# Patient Record
Sex: Female | Born: 1935
Health system: Southern US, Community
[De-identification: ages and names within clinical notes are randomized; demographics above are authoritative.]

## PROBLEM LIST (undated history)

## (undated) DIAGNOSIS — I10 Essential (primary) hypertension: Secondary | ICD-10-CM

## (undated) DIAGNOSIS — L120 Bullous pemphigoid: Secondary | ICD-10-CM

## (undated) HISTORY — DX: Bullous pemphigoid: L12.0

## (undated) HISTORY — PX: SPINE SURGERY: SHX786

## (undated) HISTORY — DX: Essential (primary) hypertension: I10

---

## 1978-08-02 HISTORY — PX: APPENDECTOMY: SHX54

## 1978-08-02 HISTORY — PX: BREAST BIOPSY: SHX20

## 1978-08-02 HISTORY — PX: VAGINAL HYSTERECTOMY: SHX2639

## 2000-08-09 ENCOUNTER — Ambulatory Visit (HOSPITAL_BASED_OUTPATIENT_CLINIC_OR_DEPARTMENT_OTHER): Admission: RE | Admit: 2000-08-09 | Discharge: 2000-08-09 | Payer: Self-pay | Admitting: Plastic Surgery

## 2003-09-10 ENCOUNTER — Encounter: Admission: RE | Admit: 2003-09-10 | Discharge: 2003-09-10 | Payer: Self-pay | Admitting: Orthopaedic Surgery

## 2005-02-22 ENCOUNTER — Encounter: Admission: RE | Admit: 2005-02-22 | Discharge: 2005-02-22 | Payer: Self-pay | Admitting: Family Medicine

## 2014-08-13 DIAGNOSIS — L120 Bullous pemphigoid: Secondary | ICD-10-CM | POA: Diagnosis not present

## 2014-08-13 DIAGNOSIS — M25552 Pain in left hip: Secondary | ICD-10-CM | POA: Diagnosis not present

## 2014-08-13 DIAGNOSIS — E780 Pure hypercholesterolemia: Secondary | ICD-10-CM | POA: Diagnosis not present

## 2014-08-13 DIAGNOSIS — G8929 Other chronic pain: Secondary | ICD-10-CM | POA: Diagnosis not present

## 2014-08-15 DIAGNOSIS — L821 Other seborrheic keratosis: Secondary | ICD-10-CM | POA: Diagnosis not present

## 2014-08-15 DIAGNOSIS — L570 Actinic keratosis: Secondary | ICD-10-CM | POA: Diagnosis not present

## 2014-11-07 DIAGNOSIS — H04123 Dry eye syndrome of bilateral lacrimal glands: Secondary | ICD-10-CM | POA: Diagnosis not present

## 2014-11-07 DIAGNOSIS — H25013 Cortical age-related cataract, bilateral: Secondary | ICD-10-CM | POA: Diagnosis not present

## 2014-11-07 DIAGNOSIS — H2513 Age-related nuclear cataract, bilateral: Secondary | ICD-10-CM | POA: Diagnosis not present

## 2014-11-07 DIAGNOSIS — I7090 Unspecified atherosclerosis: Secondary | ICD-10-CM | POA: Diagnosis not present

## 2014-11-29 DIAGNOSIS — I10 Essential (primary) hypertension: Secondary | ICD-10-CM | POA: Diagnosis not present

## 2014-12-31 DIAGNOSIS — I10 Essential (primary) hypertension: Secondary | ICD-10-CM | POA: Diagnosis not present

## 2014-12-31 DIAGNOSIS — K219 Gastro-esophageal reflux disease without esophagitis: Secondary | ICD-10-CM | POA: Diagnosis not present

## 2015-01-04 DIAGNOSIS — T7840XA Allergy, unspecified, initial encounter: Secondary | ICD-10-CM | POA: Diagnosis not present

## 2015-02-04 DIAGNOSIS — I10 Essential (primary) hypertension: Secondary | ICD-10-CM | POA: Diagnosis not present

## 2015-02-04 DIAGNOSIS — E780 Pure hypercholesterolemia: Secondary | ICD-10-CM | POA: Diagnosis not present

## 2015-02-06 DIAGNOSIS — M25552 Pain in left hip: Secondary | ICD-10-CM | POA: Diagnosis not present

## 2015-02-06 DIAGNOSIS — G8929 Other chronic pain: Secondary | ICD-10-CM | POA: Diagnosis not present

## 2015-02-06 DIAGNOSIS — K219 Gastro-esophageal reflux disease without esophagitis: Secondary | ICD-10-CM | POA: Diagnosis not present

## 2015-02-06 DIAGNOSIS — E780 Pure hypercholesterolemia: Secondary | ICD-10-CM | POA: Diagnosis not present

## 2015-02-06 DIAGNOSIS — I10 Essential (primary) hypertension: Secondary | ICD-10-CM | POA: Diagnosis not present

## 2015-02-21 DIAGNOSIS — I10 Essential (primary) hypertension: Secondary | ICD-10-CM | POA: Diagnosis not present

## 2015-02-21 DIAGNOSIS — M791 Myalgia: Secondary | ICD-10-CM | POA: Diagnosis not present

## 2015-02-21 DIAGNOSIS — R42 Dizziness and giddiness: Secondary | ICD-10-CM | POA: Diagnosis not present

## 2015-02-21 DIAGNOSIS — K30 Functional dyspepsia: Secondary | ICD-10-CM | POA: Diagnosis not present

## 2015-02-26 DIAGNOSIS — I10 Essential (primary) hypertension: Secondary | ICD-10-CM | POA: Diagnosis not present

## 2015-02-26 DIAGNOSIS — R001 Bradycardia, unspecified: Secondary | ICD-10-CM | POA: Diagnosis not present

## 2015-03-10 DIAGNOSIS — I10 Essential (primary) hypertension: Secondary | ICD-10-CM | POA: Diagnosis not present

## 2015-03-10 DIAGNOSIS — K219 Gastro-esophageal reflux disease without esophagitis: Secondary | ICD-10-CM | POA: Diagnosis not present

## 2015-03-10 DIAGNOSIS — R197 Diarrhea, unspecified: Secondary | ICD-10-CM | POA: Diagnosis not present

## 2015-03-17 DIAGNOSIS — I10 Essential (primary) hypertension: Secondary | ICD-10-CM | POA: Diagnosis not present

## 2015-04-18 DIAGNOSIS — Z23 Encounter for immunization: Secondary | ICD-10-CM | POA: Diagnosis not present

## 2015-04-18 DIAGNOSIS — N183 Chronic kidney disease, stage 3 (moderate): Secondary | ICD-10-CM | POA: Diagnosis not present

## 2015-04-18 DIAGNOSIS — Z0000 Encounter for general adult medical examination without abnormal findings: Secondary | ICD-10-CM | POA: Diagnosis not present

## 2015-07-08 DIAGNOSIS — L821 Other seborrheic keratosis: Secondary | ICD-10-CM | POA: Diagnosis not present

## 2015-07-08 DIAGNOSIS — L570 Actinic keratosis: Secondary | ICD-10-CM | POA: Diagnosis not present

## 2015-07-24 DIAGNOSIS — M25551 Pain in right hip: Secondary | ICD-10-CM | POA: Diagnosis not present

## 2015-08-19 DIAGNOSIS — I10 Essential (primary) hypertension: Secondary | ICD-10-CM | POA: Diagnosis not present

## 2015-08-19 DIAGNOSIS — E7800 Pure hypercholesterolemia, unspecified: Secondary | ICD-10-CM | POA: Diagnosis not present

## 2015-08-19 DIAGNOSIS — K219 Gastro-esophageal reflux disease without esophagitis: Secondary | ICD-10-CM | POA: Diagnosis not present

## 2015-11-13 DIAGNOSIS — H2513 Age-related nuclear cataract, bilateral: Secondary | ICD-10-CM | POA: Diagnosis not present

## 2015-11-13 DIAGNOSIS — I7090 Unspecified atherosclerosis: Secondary | ICD-10-CM | POA: Diagnosis not present

## 2015-11-13 DIAGNOSIS — H25013 Cortical age-related cataract, bilateral: Secondary | ICD-10-CM | POA: Diagnosis not present

## 2015-11-13 DIAGNOSIS — H35033 Hypertensive retinopathy, bilateral: Secondary | ICD-10-CM | POA: Diagnosis not present

## 2016-01-05 ENCOUNTER — Encounter: Payer: Self-pay | Admitting: Family Medicine

## 2016-01-05 ENCOUNTER — Ambulatory Visit (INDEPENDENT_AMBULATORY_CARE_PROVIDER_SITE_OTHER): Payer: Medicare Other | Admitting: Family Medicine

## 2016-01-05 VITALS — BP 180/95 | HR 60 | Temp 98.4°F | Resp 12 | Ht 61.75 in | Wt 132.7 lb

## 2016-01-05 DIAGNOSIS — I10 Essential (primary) hypertension: Secondary | ICD-10-CM

## 2016-01-05 DIAGNOSIS — G4700 Insomnia, unspecified: Secondary | ICD-10-CM

## 2016-01-05 DIAGNOSIS — R001 Bradycardia, unspecified: Secondary | ICD-10-CM

## 2016-01-05 DIAGNOSIS — E782 Mixed hyperlipidemia: Secondary | ICD-10-CM | POA: Insufficient documentation

## 2016-01-05 DIAGNOSIS — E7800 Pure hypercholesterolemia, unspecified: Secondary | ICD-10-CM

## 2016-01-05 LAB — BASIC METABOLIC PANEL
BUN: 21 mg/dL (ref 6–23)
CALCIUM: 9.9 mg/dL (ref 8.4–10.5)
CO2: 29 meq/L (ref 19–32)
Chloride: 101 mEq/L (ref 96–112)
Creatinine, Ser: 0.85 mg/dL (ref 0.40–1.20)
GFR: 68.51 mL/min (ref >60.00)
Glucose, Bld: 89 mg/dL (ref 70–99)
POTASSIUM: 3.6 meq/L (ref 3.5–5.1)
SODIUM: 139 meq/L (ref 135–145)

## 2016-01-05 LAB — TSH: TSH: 2.27 u[IU]/mL (ref 0.35–4.50)

## 2016-01-05 NOTE — Progress Notes (Signed)
[name] is a 81 y.o.female, who is here today to established care with me and to follow on HTN.  Former PCP Amber Baird. She follows with ophthalmologists periodically. Today she is concerned about elevated BP. Dx with HTN in 11/2014, BP 200/90.  Currently she is on Lisinopril 20 mg, Carvedilol 6.25 mg bid, and HCTZ 25 mg daily. She is taking medications as instructed, no side effects reported, except for feeling cold at night,sometimes.  She has not noted unusual headache, visual changes, exertional chest pain or dyspnea,  focal weakness, or edema. Occasionally she feels like "there is not enough air in the room", mildly difficulty breathing. No dizziness or diaphoresis. No Hx of tobacco use. Denies Hx of CVD.  She has not tolerated Amlodipine or Atenolol in the past, the former one caused facial rash and the latter one bradycardia at 40/min. BP readings at home: 160's/70's x 4, 170/80 x 1, and 155/80 x 1. HR's at home 51-57/min.   Hyperlipidemia: She is on Pravastatin. Following a low fat diet. She has not noted side effects with medication.  Insomnia: Currently she is on gabapentin 300 mg at bedtime, initially it was a started about 1-2 years ago because post herpetic pain but noticed that she slept better with medication, so she continued it. She sleeps about 7 hours, feels rested next day, she denies any side effect from medication.  Takes Mg Oxide for muscle cramps. She is reporting lab work done in 08/2015, Mg is usually check. She lives alone.    Review of Systems  Constitutional: Negative for fever, activity change, appetite change, fatigue and unexpected weight change.  HENT: Negative for mouth sores, nosebleeds and trouble swallowing.   Eyes: Negative for pain, redness and visual disturbance.  Respiratory: Negative for apnea, cough, shortness of breath and wheezing.   Cardiovascular: Negative for chest pain, palpitations and leg swelling.    Gastrointestinal: Negative for nausea, vomiting and abdominal pain.       Negative for changes in bowel habits.  Endocrine: Positive for cold intolerance. Negative for polydipsia and polyphagia.  Genitourinary: Negative for dysuria, hematuria, decreased urine volume and difficulty urinating.  Musculoskeletal: Negative for back pain.  Skin: Negative for pallor and rash.  Neurological: Negative for seizures, syncope, weakness, numbness and headaches.  Psychiatric/Behavioral: Negative for hallucinations, confusion and sleep disturbance. The patient is not nervous/anxious.      No current outpatient prescriptions on file prior to visit.   No current facility-administered medications on file prior to visit.     Past Medical History  Diagnosis Date  . Hypertension   . Bullous pemphigoid     Allergies  Allergen Reactions  . Adhesive [Tape]   . Amlodipine   . Atenolol   . Sulfa Antibiotics     Social History   Social History  . Marital Status: Married    Spouse Name: N/A  . Number of Children: 1  . Years of Education: N/A   Social History Main Topics  . Smoking status: Never Smoker   . Smokeless tobacco: Never Used  . Alcohol Use: No  . Drug Use: No  . Sexual Activity: Not Asked   Other Topics Concern  . None   Social History Narrative    Filed Vitals:   01/05/16 1400  BP: 180/95  Pulse: 60  Temp: 98.4 F (36.9 C)  Resp: 12   Body mass index is 24.48 kg/(m^2).  SpO2 Readings from Last 3 Encounters:  01/05/16 92%  Physical Exam  Constitutional: She is oriented to person, place, and time. She appears well-developed and well-nourished. She does not appear ill. No distress.  HENT:  Head: Atraumatic.  Eyes: Conjunctivae and EOM are normal. Pupils are equal, round, and reactive to light.  Neck: No JVD present. No thyromegaly present.  Cardiovascular: Normal rate and regular rhythm.   Pulses:      Dorsalis pedis pulses are 2+ on the right side, and 2+  on the left side.  ? Soft SEM RUSB.  Respiratory: Effort normal and breath sounds normal. No respiratory distress.  GI: Soft. She exhibits no mass. There is no tenderness.  Musculoskeletal: She exhibits no edema.  Kyphosis.  Lymphadenopathy:    She has no cervical adenopathy.  Neurological: She is alert and oriented to person, place, and time. She has normal strength. No cranial nerve deficit. Coordination normal.  Stable gait with no assistance.  Skin: Skin is warm. She is not diaphoretic. No erythema. No pallor.  Psychiatric: She has a normal mood and affect.  Well groomed, good eye contact.    ASSESSMENT AND PLAN:   [place] was seen today for new patient (initial visit).  Diagnoses and all orders for this visit:  Essential hypertension  Not well controlled, re-checked SBP mildly lower that initial reading (218). Carvedilol decreased due to bradycardia and Lisinopril increased from 20 mg to 30 mg. Possible complications of elevated BP discussed. Annual eye examination to continue as well as low salt diet. Stop Aspirin for now. Clearly instructed about warning signs.  F/U in 2 weeks.  -     EKG 12-Lead -     Basic metabolic panel -     TSH  Bradycardia  Today HR on lower normal range but HR < 60 reported at home. Asymptomatic. Decrease Carvedilol. Continue monitoring. EKG today no acute ischemia, SR, normal axis and intervals, otherwise normal.No other available.  -     EKG 12-Lead  Hypercholesteremia  Stable. No changes in current management. F/U in 6-12 months.   Insomnia, unspecified  Stable. Explained Gabapentin is not usually recommended for insomnia but since she is tolerating well and it is helping, so changes in current management. Good sleep hygiene.  F/U in 6-12 months.     -Patient advised to return sooner than planned today if new concerns arise, she voices understanding.     Pier Laux G. Martinique, MD  [facility]. [place]  office.

## 2016-01-05 NOTE — Patient Instructions (Addendum)
A few things to remember from today's visit:   1. Essential hypertension  - lisinopril (PRINIVIL,ZESTRIL) 20 MG tablet; Take 1.5 tablets by mouth daily. Increased - carvedilol (COREG) 6.25 MG tablet; Take 0.5 tablets by mouth 2 (two) times daily. decreased - EKG [id number] - Basic metabolic panel - TSH  2. Hypercholesteremia  - pravastatin (PRAVACHOL) 40 MG tablet; Take 1 tablet by mouth daily.  3. Bradycardia  - EKG 12-Lead  4. Insomnia, unspecified  - gabapentin (NEURONTIN) 300 MG capsule; Take 1 capsule by mouth daily.   Some changes in the following medications: Increase lisinopril to 1.5 tablet daily, decrease carvedilol to half tablet 2 times daily. EKG was done today, otherwise normal. Continue monitoring blood pressure and pulse at home. Please arrange follow up appointment before leaving today.   BP goal < 150/90. If any chest pain, shortness of breath, headache, or other worrisome symptom seek immediate medical attention. Stop Aspirin for now.   If you sign-up for My chart, you can communicate easier with [place] in case you have any question or concern.

## 2016-01-07 ENCOUNTER — Telehealth: Payer: Self-pay

## 2016-01-07 NOTE — Telephone Encounter (Signed)
-----   Message from Betty G Martinique, MD sent at 01/07/2016  8:28 AM EDT ----- Normal lab results: thyroid function, glucose, and renal function. Thanks!

## 2016-01-07 NOTE — Telephone Encounter (Signed)
Called patient. Gave lab results. Patient verbalized understanding.  

## 2016-01-12 ENCOUNTER — Other Ambulatory Visit: Payer: Self-pay | Admitting: Family Medicine

## 2016-01-12 ENCOUNTER — Encounter: Payer: Self-pay | Admitting: Family Medicine

## 2016-01-12 ENCOUNTER — Ambulatory Visit (INDEPENDENT_AMBULATORY_CARE_PROVIDER_SITE_OTHER): Payer: Medicare Other | Admitting: Family Medicine

## 2016-01-12 VITALS — BP 172/84 | HR 73 | Temp 98.6°F | Resp 17 | Ht 61.75 in | Wt 131.6 lb

## 2016-01-12 DIAGNOSIS — I10 Essential (primary) hypertension: Secondary | ICD-10-CM | POA: Diagnosis not present

## 2016-01-12 MED ORDER — HYDRALAZINE HCL 25 MG PO TABS: 25.0000 mg | ORAL_TABLET | Freq: Three times a day (TID) | ORAL | Status: DC

## 2016-01-12 MED ORDER — CLONIDINE HCL 0.1 MG PO TABS
0.1000 mg | ORAL_TABLET | Freq: Once | ORAL | Status: AC
  Administered 2016-01-12: 0.1 mg via ORAL

## 2016-01-12 NOTE — Progress Notes (Signed)
Pre visit review using our clinic review tool, if applicable. No additional management support is needed unless otherwise documented below in the visit note. 

## 2016-01-12 NOTE — Telephone Encounter (Signed)
90 day script sent to pharmacy 

## 2016-01-12 NOTE — Progress Notes (Signed)
Amber Baird is a 81 y.o.female, who is here today to follow on HTN, last OV, 01/05/16, her Carvedilol was decreased from 6.25 mg bid. Concerned about BP readings.  Feeling "fuggy in the head" and feeling hot, these symptoms have been present for a while but worse for the past few days, worse last night and better today.   Currently she is on Carvedilol 6.25 mg 1/2 tab bid, Lisinopril 30 mg, and HCTZ.  She is taking medications as instructed, no side effects reported.  She has not noted any headache, exertional chest pain, dyspnea,  focal weakness, or edema. + Visual changes: difficulty focusing, no aura or vision loss, no eye pain. No changes in chronic tinnitus.   Lab Results  Component Value Date   CREATININE 0.85 01/05/2016   BUN 21 01/05/2016   NA 139 01/05/2016   K 3.6 01/05/2016   CL 101 01/05/2016   CO2 29 01/05/2016    190/96 today morning. She states that her BP has been "200/100 many times" but she did not feel as she had felt for the past few days.  On Lisinopril for a year. She states that her BP "never" has been well controlled.    Review of Systems  Constitutional: Positive for fatigue. Negative for fever, activity change, appetite change and unexpected weight change.  HENT: Negative for mouth sores, nosebleeds and trouble swallowing.   Eyes: Positive for visual disturbance. Negative for photophobia, pain and redness.  Respiratory: Negative for cough, shortness of breath and wheezing.   Cardiovascular: Negative for chest pain, palpitations and leg swelling.  Gastrointestinal: Positive for nausea (here in the office). Negative for vomiting and abdominal pain.       Negative for changes in bowel habits.  Genitourinary: Negative for dysuria, hematuria, decreased urine volume and difficulty urinating.  Musculoskeletal: Negative for gait problem.  Skin: Negative for color change and rash.  Neurological: Negative for seizures, syncope, speech difficulty,  weakness, numbness and headaches. Dizziness: occasional.  Psychiatric/Behavioral: Positive for sleep disturbance (trouble falling asleep). Negative for confusion. The patient is nervous/anxious.       Current Outpatient Prescriptions on File Prior to Visit  Medication Sig Dispense Refill  . Biotin (BIOTIN 5000) 5 MG CAPS Take 1 capsule by mouth daily.    . carvedilol (COREG) 6.25 MG tablet Take 0.5 tablets by mouth 2 (two) times daily. decreased    . Cholecalciferol (VITAMIN D-3) 1000 units CAPS Take 1 capsule by mouth daily.    Marland Kitchen gabapentin (NEURONTIN) 300 MG capsule Take 1 capsule by mouth daily.    . hydrochlorothiazide (HYDRODIURIL) 25 MG tablet Take 25 mg by mouth daily.    Marland Kitchen lisinopril (PRINIVIL,ZESTRIL) 20 MG tablet Take 1 tablet by mouth daily. decreased    . Omega-3 Fatty Acids (FISH OIL) 1200 MG CAPS Take 1 capsule by mouth daily.    . pravastatin (PRAVACHOL) 40 MG tablet Take 1 tablet by mouth daily.    . vitamin B-12 (CYANOCOBALAMIN) 1000 MCG tablet Take 1,000 mcg by mouth daily.     No current facility-administered medications on file prior to visit.     Past Medical History  Diagnosis Date  . Hypertension   . Bullous pemphigoid     Allergies  Allergen Reactions  . Adhesive [Tape]   . Amlodipine   . Atenolol   . Sulfa Antibiotics     Social History   Social History  . Marital Status: Married    Spouse Name: N/A  .  Number of Children: 1  . Years of Education: N/A   Social History Main Topics  . Smoking status: Never Smoker   . Smokeless tobacco: Never Used  . Alcohol Use: No  . Drug Use: No  . Sexual Activity: Not Asked   Other Topics Concern  . None   Social History Narrative    Filed Vitals:   01/12/16 1427 01/12/16 1507  BP: 200/98 172/84  Pulse:    Temp:    Resp:     Body mass index is 24.28 kg/(m^2).  SpO2 Readings from Last 3 Encounters:  01/12/16 98%  01/05/16 92%     Physical Exam  Constitutional: She is oriented to person,  place, and time. She appears well-developed and well-nourished. She does not appear ill. No distress.  HENT:  Head: Atraumatic.  Eyes: Conjunctivae and EOM are normal. Pupils are equal, round, and reactive to light.  Neck: No JVD present. No muscular tenderness present.  Cardiovascular: Normal rate and regular rhythm.   No murmur heard. Pulses:      Dorsalis pedis pulses are 2+ on the right side, and 2+ on the left side.  Respiratory: Effort normal and breath sounds normal. No respiratory distress.  GI: Soft. She exhibits no mass. There is no tenderness.  Musculoskeletal: She exhibits no edema or tenderness.  Lymphadenopathy:    She has no cervical adenopathy.  Neurological: She is alert and oriented to person, place, and time. She has normal strength. No cranial nerve deficit or sensory deficit. Coordination and gait normal.  Stable gait with no assistance.  Skin: Skin is warm. No erythema.  Psychiatric: Her speech is normal. Her mood appears anxious.  Well groomed, good eye contact.    ASSESSMENT AND PLAN:   Amber Baird was seen today for medication management.  Diagnoses and all orders for this visit:  Essential hypertension -     cloNIDine (CATAPRES) tablet 0.1 mg; Take 1 tablet (0.1 mg total) by mouth once. -     Discontinue: hydrALAZINE (APRESOLINE) 25 MG tablet; Take 1 tablet (25 mg total) by mouth 3 (three) times daily. -     Basic metabolic panel -     CBC with Differential/Platelet -     Aldosterone + renin activity w/ ratio    She received Clonidine 0.1 mg PO after verbal consent. Stay in observation for about 45 minutes, BP improved and nausea resolved. She denies any symptom at the time of her discharge and in general she is feeling better. Hydralazine added, Lisinopril decreased, rest no changes. She is calling her sister to stay with her for a few days. Clearly instructed about warning signs in which case she will call 911. BP check in 3 days. Keep f/u appt in 1  week. I may consider renal artery Doppler (vs abdomen CT) and/or Spironolactone according to lab results.   -She was advised to return sooner than planned today if new concerns arise, voices understanding.     Zebastian Carico G. Martinique, MD  [facility]. [place] office.

## 2016-01-12 NOTE — Patient Instructions (Signed)
A few things to remember from today's visit:   1. Essential hypertension  Lisinopril decreased to 20 mg, HCTZ and Carvedilol no changes. Hydralazine added.  Call 911 if any warning signs. BP check in 3 days,keep next appt. - cloNIDine (CATAPRES) tablet 0.1 mg; Take 1 tablet (0.1 mg total) by mouth once. - Basic metabolic panel - CBC with Differential/Platelet - Aldosterone + renin activity w/ ratio      If you sign-up for My chart, you can communicate easier with [place] in case you have any question or concern.

## 2016-01-13 ENCOUNTER — Emergency Department (HOSPITAL_COMMUNITY)
Admission: EM | Admit: 2016-01-13 | Discharge: 2016-01-14 | Disposition: A | Discharge status: Home / Self Care | Payer: Medicare Other | Attending: Emergency Medicine | Admitting: Emergency Medicine

## 2016-01-13 ENCOUNTER — Encounter (HOSPITAL_COMMUNITY): Payer: Self-pay | Admitting: Emergency Medicine

## 2016-01-13 DIAGNOSIS — Z79899 Other long term (current) drug therapy: Secondary | ICD-10-CM | POA: Diagnosis not present

## 2016-01-13 DIAGNOSIS — R51 Headache: Secondary | ICD-10-CM | POA: Diagnosis not present

## 2016-01-13 DIAGNOSIS — I10 Essential (primary) hypertension: Secondary | ICD-10-CM | POA: Diagnosis not present

## 2016-01-13 DIAGNOSIS — R519 Headache, unspecified: Secondary | ICD-10-CM

## 2016-01-13 LAB — CBC
HEMATOCRIT: 40.8 % (ref 36.0–46.0)
HEMOGLOBIN: 14.8 g/dL (ref 12.0–15.0)
MCH: 31.8 pg (ref 26.0–34.0)
MCHC: 36.3 g/dL — AB (ref 30.0–36.0)
MCV: 87.6 fL (ref 78.0–100.0)
Platelets: 271 10*3/uL (ref 150–400)
RBC: 4.66 MIL/uL (ref 3.87–5.11)
RDW: 12.8 % (ref 11.5–15.5)
WBC: 7.2 10*3/uL (ref 4.0–10.5)

## 2016-01-13 LAB — BASIC METABOLIC PANEL
Anion gap: 10 (ref 5–15)
BUN: 19 mg/dL (ref 6–23)
BUN: 20 mg/dL (ref 6–20)
CHLORIDE: 98 meq/L (ref 96–112)
CHLORIDE: 101 mmol/L (ref 101–111)
CO2: 30 meq/L (ref 19–32)
CO2: 24 mmol/L (ref 22–32)
CREATININE: 0.94 mg/dL (ref 0.40–1.20)
CREATININE: 0.99 mg/dL (ref 0.44–1.00)
Calcium: 9.8 mg/dL (ref 8.4–10.5)
Calcium: 9.8 mg/dL (ref 8.9–10.3)
GFR calc non Af Amer: 53 mL/min — ABNORMAL LOW (ref >60)
GFR: 61 mL/min (ref >60.00)
GLUCOSE: 114 mg/dL — AB (ref 65–99)
Glucose, Bld: 109 mg/dL — ABNORMAL HIGH (ref 70–99)
POTASSIUM: 3.7 meq/L (ref 3.5–5.1)
Potassium: 4.9 mmol/L (ref 3.5–5.1)
Sodium: 136 mEq/L (ref 135–145)
Sodium: 135 mmol/L (ref 135–145)

## 2016-01-13 LAB — CBC WITH DIFFERENTIAL/PLATELET
BASOS ABS: 0 10*3/uL (ref 0.0–0.1)
Basophils Relative: 0.6 % (ref 0.0–3.0)
EOS PCT: 3.3 % (ref 0.0–5.0)
Eosinophils Absolute: 0.3 10*3/uL (ref 0.0–0.7)
HEMATOCRIT: 41 % (ref 36.0–46.0)
HEMOGLOBIN: 14 g/dL (ref 12.0–15.0)
LYMPHS ABS: 1.6 10*3/uL (ref 0.7–4.0)
LYMPHS PCT: 21.8 % (ref 12.0–46.0)
MCHC: 34.1 g/dL (ref 30.0–36.0)
MCV: 91.4 fl (ref 78.0–100.0)
MONOS PCT: 7.9 % (ref 3.0–12.0)
Monocytes Absolute: 0.6 10*3/uL (ref 0.1–1.0)
NEUTROS PCT: 66.4 % (ref 43.0–77.0)
Neutro Abs: 5 10*3/uL (ref 1.4–7.7)
Platelets: 240 10*3/uL (ref 150.0–400.0)
RBC: 4.49 Mil/uL (ref 3.87–5.11)
RDW: 13.8 % (ref 11.5–15.5)
WBC: 7.6 10*3/uL (ref 4.0–10.5)

## 2016-01-13 NOTE — ED Provider Notes (Signed)
By signing my name below, I, Amber Baird, attest that this documentation has been prepared under the direction and in the presence of Willis, DO. Electronically Signed: Altamease Baird, ED Scribe. 01/14/2016. 12:15 AM.  TIME SEEN: 11:59 PM  CHIEF COMPLAINT: Headache  HPI: Amber Baird is a 81 y.o. female with PMHx of HTN who presents to the Emergency Department complaining of a constant 4/10 in severity headache with gradual onset approximately 9 hours ago. The pain is behind the eyes and at the back of the head and she notes that it is like previous migraines. Prior to today her last migraine was approximately 20 years ago and she treated her headaches with Excedrin at that time. She checked her blood pressure at home because it was elevated at her last 2 doctors appointments, including the one yesterday, and noted that it was elevated tonight.  States that her blood pressure has been elevated usually in the 190s/80s for the past year and a half. Pt denies numbness, weakness, chest pain, SOB, vision change, change in hearing or speech. Her PCP is Dr. Betty Martinique and she was recently started on hydralazine and her lisinopril and carvedilol were decreased. Today she took her morning and afternoon doses of coreg and hydralazine  And morning dose of HCTZ.  ROS: See HPI Constitutional: no fever  Eyes: no drainage  ENT: no runny nose   Cardiovascular:  no chest pain  Resp: no SOB  GI: no vomiting GU: no dysuria Integumentary: no rash  Allergy: no hives  Musculoskeletal: no leg swelling  Neurological: no slurred speech ROS otherwise negative  PAST MEDICAL HISTORY/PAST SURGICAL HISTORY:  Past Medical History  Diagnosis Date  . Hypertension   . Bullous pemphigoid     MEDICATIONS:  Prior to Admission medications   Medication Sig Start Date End Date Taking? Authorizing Provider  Biotin (BIOTIN 5000) 5 MG CAPS Take 1 capsule by mouth daily.    Historical Provider, MD   carvedilol (COREG) 6.25 MG tablet Take 0.5 tablets by mouth 2 (two) times daily. decreased 12/31/15   Historical Provider, MD  Cholecalciferol (VITAMIN D-3) 1000 units CAPS Take 1 capsule by mouth daily.    Historical Provider, MD  gabapentin (NEURONTIN) 300 MG capsule Take 1 capsule by mouth daily. 08/20/15   Historical Provider, MD  hydrALAZINE (APRESOLINE) 25 MG tablet TAKE 1 TABLET(25 MG) BY MOUTH THREE TIMES DAILY 01/12/16   Betty G Martinique, MD  hydrochlorothiazide (HYDRODIURIL) 25 MG tablet Take 25 mg by mouth daily. 11/24/15   Historical Provider, MD  lisinopril (PRINIVIL,ZESTRIL) 20 MG tablet Take 1 tablet by mouth daily. decreased 08/19/15   Historical Provider, MD  Omega-3 Fatty Acids (FISH OIL) 1200 MG CAPS Take 1 capsule by mouth daily.    Historical Provider, MD  pravastatin (PRAVACHOL) 40 MG tablet Take 1 tablet by mouth daily. 10/01/15   Historical Provider, MD  vitamin B-12 (CYANOCOBALAMIN) 1000 MCG tablet Take 1,000 mcg by mouth daily.    Historical Provider, MD    ALLERGIES:  Allergies  Allergen Reactions  . Adhesive [Tape]   . Amlodipine   . Atenolol   . Sulfa Antibiotics     SOCIAL HISTORY:  Social History  Substance Use Topics  . Smoking status: Never Smoker   . Smokeless tobacco: Never Used  . Alcohol Use: No    FAMILY HISTORY: Family History  Problem Relation Age of Onset  . Arthritis    . High Cholesterol Mother   . Heart disease  Mother   . Hypertension Mother     EXAM: BP 231/85 mmHg  Pulse 92  Temp(Src) 98.2 F (36.8 C) (Oral)  Resp 18  SpO2 100% CONSTITUTIONAL: Alert and oriented and responds appropriately to questions. Elderly and chronically ill-appearing, flushed face HEAD: Normocephalic EYES: Conjunctivae clear, PERRL ENT: normal nose; no rhinorrhea; moist mucous membranes NECK: Supple, no meningismus, no LAD  CARD: RRR; S1 and S2 appreciated; no murmurs, no clicks, no rubs, no gallops RESP: Normal chest excursion without splinting or  tachypnea; breath sounds clear and equal bilaterally; no wheezes, no rhonchi, no rales, no hypoxia or respiratory distress, speaking full sentences ABD/GI: Normal bowel sounds; non-distended; soft, non-tender, no rebound, no guarding, no peritoneal signs BACK:  The back appears normal and is non-tender to palpation, there is no CVA tenderness EXT: Normal ROM in all joints; non-tender to palpation; no edema; normal capillary refill; no cyanosis, no calf tenderness or swelling    SKIN: Normal color for age and race; warm; no rash NEURO: Moves all extremities equally, sensation to light touch intact diffusely, cranial nerves II through XII intact, no dysmetria to finger nose testing bilaterally  PSYCH: The patient's mood and manner are appropriate. Grooming and personal hygiene are appropriate.  MEDICAL DECISION MAKING: Patient here with hypertension. Complains of a headache today. Gradual in onset, not sudden onset or thunderclap, no worst headache of her life. She has had similar headaches before. Patient states she is very frustrated because it is difficult to get her blood pressure under control. Have written that she has had elevated blood pressure for the past year and a half and currently changed primary care physicians and they have been closely monitoring her blood pressure and adjusting her blood pressure medications. Discussed with patient and family at length that will take time for her blood pressure to improve and that we do not want to lower it acutely as it may cause her a stroke. Blood pressure is currently in the 180s/70s on my evaluation. She is neurologically intact. Labs unremarkable. EKG shows possible right bundle branch block with no old for comparison. Will obtain urinalysis, CT of her head. We'll give naproxen for her headache in her nighttime doses of blood pressure medication including Coreg, hydralazine, lisinopril.  ED PROGRESS: 1:45 AM  Pt's headache is now gone. Her blood  pressure is 151/50. She is still waiting on head CT, urinalysis.   2:50 AM  Pt's blood pressure is now 145/54. Laughing and joking with her friend at bedside. Head CT is negative. She has ambulated to the bathroom without assistance with normal, steady gait.    3:15 AM  Pt's urine shows no hematuria, proteinuria. Blood pressure is 137/51. I feel she is safe to be discharged home and continue her blood pressure medication regimen as prescribed. She has follow up with her PCP next week. Discussed at length return precautions. Have advised her she has a headache in the future that she can use over-the-counter naproxen. She is still pain-free. She verbalized understanding and is comfortable with this plan.    At this time, I do not feel there is any life-threatening condition present. I have reviewed and discussed all results (EKG, imaging, lab, urine as appropriate), exam findings with patient. I have reviewed nursing notes and appropriate previous records.  I feel the patient is safe to be discharged home without further emergent workup. Discussed usual and customary return precautions. Patient and family (if present) verbalize understanding and are comfortable with this plan.  Patient will follow-up with their primary care provider. If they do not have a primary care provider, information for follow-up has been provided to them. All questions have been answered.   EKG Interpretation  Date/Time:  Tuesday January 13 2016 23:50:36 EDT Ventricular Rate:  67 PR Interval:  144 QRS Duration: 124 QT Interval:  446 QTC Calculation: 471 R Axis:   77 Text Interpretation:  Sinus rhythm IVCD, consider atypical RBBB No old tracing to compare Confirmed by WARD,  DO, KRISTEN ([id number]) on 01/13/2016 11:57:28 PM        I personally performed the services described in this documentation, which was scribed in my presence. The recorded information has been reviewed and is accurate.     [place],  DO 01/14/16 [phone]

## 2016-01-13 NOTE — ED Notes (Addendum)
Pt to ED with c/o HTN.  Also c/o headache, pain behind eyes.  St's she just doesn't feel good   Pt st's she has not taken her B/P meds tonight

## 2016-01-14 ENCOUNTER — Emergency Department (HOSPITAL_COMMUNITY): Payer: Medicare Other

## 2016-01-14 DIAGNOSIS — R51 Headache: Secondary | ICD-10-CM | POA: Diagnosis not present

## 2016-01-14 LAB — URINALYSIS, ROUTINE W REFLEX MICROSCOPIC
Bilirubin Urine: NEGATIVE
GLUCOSE, UA: NEGATIVE mg/dL
Hgb urine dipstick: NEGATIVE
KETONES UR: 15 mg/dL — AB
LEUKOCYTES UA: NEGATIVE
Nitrite: NEGATIVE
PH: 7.5 (ref 5.0–8.0)
Protein, ur: NEGATIVE mg/dL
Specific Gravity, Urine: 1.018 (ref 1.005–1.030)

## 2016-01-14 MED ORDER — HYDRALAZINE HCL 25 MG PO TABS
25.0000 mg | ORAL_TABLET | Freq: Once | ORAL | Status: AC
Start: 2016-01-14 — End: 2016-01-14
  Administered 2016-01-14: 25 mg via ORAL
  Filled 2016-01-14: qty 1

## 2016-01-14 MED ORDER — CARVEDILOL 12.5 MG PO TABS
6.2500 mg | ORAL_TABLET | Freq: Once | ORAL | Status: DC
  Filled 2016-01-14: qty 1

## 2016-01-14 MED ORDER — CARVEDILOL 3.125 MG PO TABS
3.1250 mg | ORAL_TABLET | Freq: Two times a day (BID) | ORAL | Status: DC
  Filled 2016-01-14 (×2): qty 1

## 2016-01-14 MED ORDER — LISINOPRIL 20 MG PO TABS
20.0000 mg | ORAL_TABLET | Freq: Once | ORAL | Status: AC
  Administered 2016-01-14: 20 mg via ORAL
  Filled 2016-01-14: qty 1

## 2016-01-14 MED ORDER — NAPROXEN 250 MG PO TABS
500.0000 mg | ORAL_TABLET | Freq: Once | ORAL | Status: AC
  Administered 2016-01-14: 500 mg via ORAL
  Filled 2016-01-14: qty 2

## 2016-01-14 NOTE — ED Notes (Signed)
Pt given drink 

## 2016-01-14 NOTE — Discharge Instructions (Signed)
General Headache Without Cause A headache is pain or discomfort felt around the head or neck area. The specific cause of a headache may not be found. There are many causes and types of headaches. A few common ones are:  Tension headaches.  Migraine headaches.  Cluster headaches.  Chronic daily headaches. HOME CARE INSTRUCTIONS  Watch your condition for any changes. Take these steps to help with your condition: Managing Pain  Take over-the-counter and prescription medicines only as told by your health care provider.  Lie down in a dark, quiet room when you have a headache.  If directed, apply ice to the head and neck area:  Put ice in a plastic bag.  Place a towel between your skin and the bag.  Leave the ice on for 20 minutes, 2-3 times per day.  Use a heating pad or hot shower to apply heat to the head and neck area as told by your health care provider.  Keep lights dim if bright lights bother you or make your headaches worse. Eating and Drinking  Eat meals on a regular schedule.  Limit alcohol use.  Decrease the amount of caffeine you drink, or stop drinking caffeine. General Instructions  Keep all follow-up visits as told by your health care provider. This is important.  Keep a headache journal to help find out what may trigger your headaches. For example, write down:  What you eat and drink.  How much sleep you get.  Any change to your diet or medicines.  Try massage or other relaxation techniques.  Limit stress.  Sit up straight, and do not tense your muscles.  Do not use tobacco products, including cigarettes, chewing tobacco, or e-cigarettes. If you need help quitting, ask your health care provider.  Exercise regularly as told by your health care provider.  Sleep on a regular schedule. Get 7-9 hours of sleep, or the amount recommended by your health care provider. SEEK MEDICAL CARE IF:   Your symptoms are not helped by medicine.  You have a  headache that is different from the usual headache.  You have nausea or you vomit.  You have a fever. SEEK IMMEDIATE MEDICAL CARE IF:   Your headache becomes severe.  You have repeated vomiting.  You have a stiff neck.  You have a loss of vision.  You have problems with speech.  You have pain in the eye or ear.  You have muscular weakness or loss of muscle control.  You lose your balance or have trouble walking.  You feel faint or pass out.  You have confusion.   This information is not intended to replace advice given to you by your health care provider. Make sure you discuss any questions you have with your health care provider.   Document Released: 07/19/2005 Document Revised: 04/09/2015 Document Reviewed: 11/11/2014 Elsevier Interactive Patient Education 2016 [employer] DASH stands for "Dietary Approaches to Stop Hypertension." The DASH eating plan is a healthy eating plan that has been shown to reduce high blood pressure (hypertension). Additional health benefits may include reducing the risk of type 2 diabetes mellitus, heart disease, and stroke. The DASH eating plan may also help with weight loss. WHAT DO I NEED TO KNOW ABOUT THE DASH EATING PLAN? For the DASH eating plan, you will follow these general guidelines:  Choose foods with a percent daily value for sodium of less than 5% (as listed on the food label).  Use salt-free seasonings or herbs instead of  table salt or sea salt.  Check with your health care provider or pharmacist before using salt substitutes.  Eat lower-sodium products, often labeled as "lower sodium" or "no salt added."  Eat fresh foods.  Eat more vegetables, fruits, and low-fat dairy products.  Choose whole grains. Look for the word "whole" as the first word in the ingredient list.  Choose fish and skinless chicken or [place] more often than red meat. Limit fish, poultry, and meat to 6 oz (170 g) each day.  Limit  sweets, desserts, sugars, and sugary drinks.  Choose heart-healthy fats.  Limit cheese to 1 oz (28 g) per day.  Eat more home-cooked food and less restaurant, buffet, and fast food.  Limit fried foods.  Cook foods using methods other than frying.  Limit canned vegetables. If you do use them, rinse them well to decrease the sodium.  When eating at a restaurant, ask that your food be prepared with less salt, or no salt if possible. WHAT FOODS CAN I EAT? Seek help from a dietitian for individual calorie needs. Grains Whole grain or whole wheat bread. Brown rice. Whole grain or whole wheat pasta. Quinoa, bulgur, and whole grain cereals. Low-sodium cereals. Corn or whole wheat flour tortillas. Whole grain cornbread. Whole grain crackers. Low-sodium crackers. Vegetables Fresh or frozen vegetables (raw, steamed, roasted, or grilled). Low-sodium or reduced-sodium tomato and vegetable juices. Low-sodium or reduced-sodium tomato sauce and paste. Low-sodium or reduced-sodium canned vegetables.  Fruits All fresh, canned (in natural juice), or frozen fruits. Meat and Other Protein Products Ground beef (85% or leaner), grass-fed beef, or beef trimmed of fat. Skinless chicken or [place]. Ground chicken or [place]. Pork trimmed of fat. All fish and seafood. Eggs. Dried beans, peas, or lentils. Unsalted nuts and seeds. Unsalted canned beans. Dairy Low-fat dairy products, such as skim or 1% milk, 2% or reduced-fat cheeses, low-fat ricotta or cottage cheese, or plain low-fat yogurt. Low-sodium or reduced-sodium cheeses. Fats and Oils Tub margarines without trans fats. Light or reduced-fat mayonnaise and salad dressings (reduced sodium). Avocado. Safflower, olive, or canola oils. Natural peanut or almond butter. Other Unsalted popcorn and pretzels. The items listed above may not be a complete list of recommended foods or beverages. Contact your dietitian for more options. WHAT FOODS ARE NOT  RECOMMENDED? Grains White bread. White pasta. White rice. Refined cornbread. Bagels and croissants. Crackers that contain trans fat. Vegetables Creamed or fried vegetables. Vegetables in a cheese sauce. Regular canned vegetables. Regular canned tomato sauce and paste. Regular tomato and vegetable juices. Fruits Dried fruits. Canned fruit in light or heavy syrup. Fruit juice. Meat and Other Protein Products Fatty cuts of meat. Ribs, chicken wings, bacon, sausage, bologna, salami, chitterlings, fatback, hot dogs, bratwurst, and packaged luncheon meats. Salted nuts and seeds. Canned beans with salt. Dairy Whole or 2% milk, cream, half-and-half, and cream cheese. Whole-fat or sweetened yogurt. Full-fat cheeses or blue cheese. Nondairy creamers and whipped toppings. Processed cheese, cheese spreads, or cheese curds. Condiments Onion and garlic salt, seasoned salt, table salt, and sea salt. Canned and packaged gravies. Worcestershire sauce. Tartar sauce. Barbecue sauce. Teriyaki sauce. Soy sauce, including reduced sodium. Steak sauce. Fish sauce. Oyster sauce. Cocktail sauce. Horseradish. Ketchup and mustard. Meat flavorings and tenderizers. Bouillon cubes. Hot sauce. Tabasco sauce. Marinades. Taco seasonings. Relishes. Fats and Oils Butter, stick margarine, lard, shortening, ghee, and bacon fat. Coconut, palm kernel, or palm oils. Regular salad dressings. Other Pickles and olives. Salted popcorn and pretzels. The items listed above may not  be a complete list of foods and beverages to avoid. Contact your dietitian for more information. WHERE CAN I FIND MORE INFORMATION? National Heart, Lung, and Blood Institute: travelstabloid.com   This information is not intended to replace advice given to you by your health care provider. Make sure you discuss any questions you have with your health care provider.   Document Released: 07/08/2011 Document Revised: 08/09/2014  Document Reviewed: 05/23/2013 Elsevier Interactive Patient Education 2016 [employer].  Hypertension Hypertension, commonly called high blood pressure, is when the force of blood pumping through your arteries is too strong. Your arteries are the blood vessels that carry blood from your heart throughout your body. A blood pressure reading consists of a higher number over a lower number, such as 110/72. The higher number (systolic) is the pressure inside your arteries when your heart pumps. The lower number (diastolic) is the pressure inside your arteries when your heart relaxes. Ideally you want your blood pressure below 120/80. Hypertension forces your heart to work harder to pump blood. Your arteries may become narrow or stiff. Having untreated or uncontrolled hypertension can cause heart attack, stroke, kidney disease, and other problems. RISK FACTORS Some risk factors for high blood pressure are controllable. Others are not.  Risk factors you cannot control include:   Race. You may be at higher risk if you are African American.  Age. Risk increases with age.  Gender. Men are at higher risk than women before age 61 years. After age 10, women are at higher risk than men. Risk factors you can control include:  Not getting enough exercise or physical activity.  Being overweight.  Getting too much fat, sugar, calories, or salt in your diet.  Drinking too much alcohol. SIGNS AND SYMPTOMS Hypertension does not usually cause signs or symptoms. Extremely high blood pressure (hypertensive crisis) may cause headache, anxiety, shortness of breath, and nosebleed. DIAGNOSIS To check if you have hypertension, your health care provider will measure your blood pressure while you are seated, with your arm held at the level of your heart. It should be measured at least twice using the same arm. Certain conditions can cause a difference in blood pressure between your right and left arms. A blood pressure  reading that is higher than normal on one occasion does not mean that you need treatment. If it is not clear whether you have high blood pressure, you may be asked to return on a different day to have your blood pressure checked again. Or, you may be asked to monitor your blood pressure at home for 1 or more weeks. TREATMENT Treating high blood pressure includes making lifestyle changes and possibly taking medicine. Living a healthy lifestyle can help lower high blood pressure. You may need to change some of your habits. Lifestyle changes may include:  Following the DASH diet. This diet is high in fruits, vegetables, and whole grains. It is low in salt, red meat, and added sugars.  Keep your sodium intake below 2,300 mg per day.  Getting at least 30-45 minutes of aerobic exercise at least 4 times per week.  Losing weight if necessary.  Not smoking.  Limiting alcoholic beverages.  Learning ways to reduce stress. Your health care provider may prescribe medicine if lifestyle changes are not enough to get your blood pressure under control, and if one of the following is true:  You are 39-27 years of age and your systolic blood pressure is above 140.  You are 58 years of age or older,  and your systolic blood pressure is above 150.  Your diastolic blood pressure is above 90.  You have diabetes, and your systolic blood pressure is over [id number] or your diastolic blood pressure is over 90.  You have kidney disease and your blood pressure is above 140/90.  You have heart disease and your blood pressure is above 140/90. Your personal target blood pressure may vary depending on your medical conditions, your age, and other factors. HOME CARE INSTRUCTIONS  Have your blood pressure rechecked as directed by your health care provider.   Take medicines only as directed by your health care provider. Follow the directions carefully. Blood pressure medicines must be taken as prescribed. The medicine does  not work as well when you skip doses. Skipping doses also puts you at risk for problems.  Do not smoke.   Monitor your blood pressure at home as directed by your health care provider. SEEK MEDICAL CARE IF:   You think you are having a reaction to medicines taken.  You have recurrent headaches or feel dizzy.  You have swelling in your ankles.  You have trouble with your vision. SEEK IMMEDIATE MEDICAL CARE IF:  You develop a severe headache or confusion.  You have unusual weakness, numbness, or feel faint.  You have severe chest or abdominal pain.  You vomit repeatedly.  You have trouble breathing. MAKE SURE YOU:   Understand these instructions.  Will watch your condition.  Will get help right away if you are not doing well or get worse.   This information is not intended to replace advice given to you by your health care provider. Make sure you discuss any questions you have with your health care provider.   Document Released: 07/19/2005 Document Revised: 12/03/2014 Document Reviewed: 05/11/2013 Elsevier Interactive Patient Education 2016 [employer].  How to Take Your Blood Pressure HOW DO I GET A BLOOD PRESSURE MACHINE?  You can buy an electronic home blood pressure machine at your local pharmacy. Insurance will sometimes cover the cost if you have a prescription.  Ask your doctor what type of machine is best for you. There are different machines for your arm and your wrist.  If you decide to buy a machine to check your blood pressure on your arm, first check the size of your arm so you can buy the right size cuff. To check the size of your arm:   Use a measuring tape that shows both inches and centimeters.   Wrap the measuring tape around the upper-middle part of your arm. You may need someone to help you measure.   Write down your arm measurement in both inches and centimeters.   To measure your blood pressure correctly, it is important to have the  right size cuff.   If your arm is up to 13 inches (up to 34 centimeters), get an adult cuff size.  If your arm is 13 to 17 inches (35 to 44 centimeters), get a large adult cuff size.    If your arm is 17 to 20 inches (45 to 52 centimeters), get an adult thigh cuff.  WHAT DO THE NUMBERS MEAN?   There are two numbers that make up your blood pressure. For example: 120/80.  The first number (120 in our example) is called the "systolic pressure." It is a measure of the pressure in your blood vessels when your heart is pumping blood.  The second number (80 in our example) is called the "diastolic pressure." It is a measure  of the pressure in your blood vessels when your heart is resting between beats.  Your doctor will tell you what your blood pressure should be. WHAT SHOULD I DO BEFORE I CHECK MY BLOOD PRESSURE?   Try to rest or relax for at least 30 minutes before you check your blood pressure.  Do not smoke.  Do not have any drinks with caffeine, such as:  Soda.  Coffee.  Tea.  Check your blood pressure in a quiet room.  Sit down and stretch out your arm on a table. Keep your arm at about the level of your heart. Let your arm relax.  Make sure that your legs are not crossed. HOW DO I CHECK MY BLOOD PRESSURE?  Follow the directions that came with your machine.  Make sure you remove any tight-fitting clothing from your arm or wrist. Wrap the cuff around your upper arm or wrist. You should be able to fit a finger between the cuff and your arm. If you cannot fit a finger between the cuff and your arm, it is too tight and should be removed and rewrapped.  Some units require you to manually pump up the arm cuff.  Automatic units inflate the cuff when you press a button.  Cuff deflation is automatic in both models.  After the cuff is inflated, the unit measures your blood pressure and pulse. The readings are shown on a monitor. Hold still and breathe normally while the cuff is  inflated.  Getting a reading takes less than a minute.  Some models store readings in a memory. Some provide a printout of readings. If your machine does not store your readings, keep a written record.  Take readings with you to your next visit with your doctor.   This information is not intended to replace advice given to you by your health care provider. Make sure you discuss any questions you have with your health care provider.   Document Released: 07/01/2008 Document Revised: 08/09/2014 Document Reviewed: 09/13/2013 Elsevier Interactive Patient Education [employer].

## 2016-01-14 NOTE — ED Notes (Signed)
MD at bedside. 

## 2016-01-15 ENCOUNTER — Encounter: Payer: Self-pay | Admitting: *Deleted

## 2016-01-15 ENCOUNTER — Ambulatory Visit: Payer: Medicare Other | Admitting: *Deleted

## 2016-01-15 VITALS — BP 144/62 | HR 58

## 2016-01-15 DIAGNOSIS — Z0130 Encounter for examination of blood pressure without abnormal findings: Secondary | ICD-10-CM

## 2016-01-15 NOTE — Progress Notes (Signed)
Patient came in for BP check - BP was 144/62 and pulse was 58. Patient stated that she is feeling much better since medication changes, and has no present symptoms.  I advised Dr. Martinique of this information. Dr. [place] advised me to tell patient to keep taking medications as directed. Patient verbalized understanding.   Patient has follow up appt on Monday 6/19 - advised patient to keep scheduled appt.

## 2016-01-16 LAB — ALDOSTERONE + RENIN ACTIVITY W/ RATIO
ALDO / PRA Ratio: 2 Ratio (ref 0.9–28.9)
Aldosterone: 3 ng/dL
PRA LC/MS/MS: 1.49 ng/mL/h (ref 0.25–5.82)

## 2016-01-19 ENCOUNTER — Ambulatory Visit (INDEPENDENT_AMBULATORY_CARE_PROVIDER_SITE_OTHER): Payer: Medicare Other | Admitting: Family Medicine

## 2016-01-19 ENCOUNTER — Telehealth: Payer: Self-pay

## 2016-01-19 ENCOUNTER — Encounter: Payer: Self-pay | Admitting: Family Medicine

## 2016-01-19 VITALS — BP 190/80 | HR 61 | Temp 97.5°F | Resp 12 | Ht 61.75 in | Wt 131.0 lb

## 2016-01-19 DIAGNOSIS — I10 Essential (primary) hypertension: Secondary | ICD-10-CM | POA: Diagnosis not present

## 2016-01-19 DIAGNOSIS — R600 Localized edema: Secondary | ICD-10-CM | POA: Diagnosis not present

## 2016-01-19 LAB — BASIC METABOLIC PANEL
BUN: 18 mg/dL (ref 6–23)
CALCIUM: 10 mg/dL (ref 8.4–10.5)
CO2: 27 meq/L (ref 19–32)
CREATININE: 1 mg/dL (ref 0.40–1.20)
Chloride: 99 mEq/L (ref 96–112)
GFR: 56.79 mL/min — ABNORMAL LOW (ref >60.00)
GLUCOSE: 94 mg/dL (ref 70–99)
Potassium: 4 mEq/L (ref 3.5–5.1)
Sodium: 137 mEq/L (ref 135–145)

## 2016-01-19 LAB — MICROALBUMIN / CREATININE URINE RATIO
Creatinine,U: 28.9 mg/dL
MICROALB/CREAT RATIO: 2.4 mg/g (ref 0.0–30.0)
Microalb, Ur: <0.7 mg/dL (ref 0.0–1.9)

## 2016-01-19 MED ORDER — HYDRALAZINE HCL 25 MG PO TABS: 50.0000 mg | ORAL_TABLET | Freq: Three times a day (TID) | ORAL | Status: DC

## 2016-01-19 NOTE — Progress Notes (Signed)
Pre visit review using our clinic review tool, if applicable. No additional management support is needed unless otherwise documented below in the visit note. 

## 2016-01-19 NOTE — Telephone Encounter (Signed)
lmov to schedule Echo  °

## 2016-01-19 NOTE — Patient Instructions (Addendum)
A few things to remember from today's visit:   1. Essential hypertension  - VAS US RENAL ARTERY DUPLEX; Future - ECHO COMPLETE; Future - Basic metabolic panel - Microalbumin/Creatinine Ratio, Urine  2. Bilateral lower extremity edema  - ECHO COMPLETE; Future - Basic metabolic panel - Microalbumin/Creatinine Ratio, Urine    LE elevation. Increase Hydralazine from 25 mg to 50 mg 3 times daily. Rest no changes. Continue monitoring blood pressure at home.   If you sign-up for My chart, you can communicate easier with [place] in case you have any question or concern.

## 2016-01-19 NOTE — Telephone Encounter (Signed)
LMOV to call office to schedule appt.  Needs Echo and Renal

## 2016-01-19 NOTE — Progress Notes (Signed)
HPI:   [name] is a 81 y.o. female, who is here today to follow on recent office visit. I saw her for elevated BP 01/12/16.  She was seen in the ER on 01/13/16 because elevated BP and headache. Head CT negative. Renal function mildly under normal range, no Hx of CKD. EKG : no acute ischemia, SR, ? IVCD.  Overall she is feeling better, no new symptoms reported. Taking medications as instructed: Yes.  Concerns today: LE edema.  Currently she is on Carvedilol 6.25 mg 1/2 tab bid, HCTZ 25 mg, Lisinopril 20 mg, and Hydralazine 25 mg tid. Carvedilol was decreased a couple weeks ago because bradycardia.  . She is taking medications as instructed, no side effects reported.  She has not noted headache sine her ER visit, visual changes, exertional chest pain, dyspnea,  focal weakness, or edema. She still feels "bad" at night, which she has had for a while and  not able to explain symptoms. When she went to the ER she was feeling "bad" but also had headache, BP 217/90's. According to pt, she had a migraine headache, prior Hx.    Lab Results  Component Value Date   CREATININE 0.99 01/13/2016   BUN 20 01/13/2016   NA 135 01/13/2016   K 4.9 01/13/2016   CL 101 01/13/2016   CO2 24 01/13/2016   Yesterday she woke up with LE edema and seems worse today. Stable during the day, no pain or erythema. She is not sure about urine frequency decreased, denies foam in urine or gross hematuria. Denies orthopnea and PND.  No leg pain. No new OTC supplement or changes in her diet. She states that she just change Lisinopril from morning to middle of the day to see if her BP was better controlled.  Lab Results  Component Value Date   TSH 2.27 01/05/2016   Aldosterone and renin within normal limits.    Review of Systems  Constitutional: Negative for fever, activity change, appetite change and unexpected weight change.  HENT: Negative for facial swelling, mouth sores,  nosebleeds and trouble swallowing.   Eyes: Negative for photophobia, pain and redness.  Respiratory: Negative for cough, shortness of breath and wheezing.   Cardiovascular: Positive for leg swelling. Negative for chest pain and palpitations.  Gastrointestinal: Negative for nausea, vomiting and abdominal pain.       Negative for changes in bowel habits.  Genitourinary: Negative for dysuria, hematuria, decreased urine volume and difficulty urinating.  Musculoskeletal: Negative for myalgias, back pain and gait problem.  Skin: Negative for color change and rash.  Neurological: Negative for seizures, syncope, facial asymmetry, speech difficulty, weakness, numbness and headaches.  Psychiatric/Behavioral: Negative for hallucinations and confusion. The patient is not nervous/anxious.       Current Outpatient Prescriptions on File Prior to Visit  Medication Sig Dispense Refill  . Biotin (BIOTIN 5000) 5 MG CAPS Take 1 capsule by mouth daily.    . carvedilol (COREG) 6.25 MG tablet Take 0.5 tablets by mouth 2 (two) times daily. decreased    . Cholecalciferol (VITAMIN D-3) 1000 units CAPS Take 1 capsule by mouth daily.    Marland Kitchen gabapentin (NEURONTIN) 300 MG capsule Take 1 capsule by mouth every evening.     . hydrochlorothiazide (HYDRODIURIL) 25 MG tablet Take 25 mg by mouth daily.    Marland Kitchen lisinopril (PRINIVIL,ZESTRIL) 20 MG tablet Take 20 mg by mouth daily.     . Omega-3 Fatty Acids (FISH OIL) 1200 MG CAPS  Take 1 capsule by mouth daily.    . pravastatin (PRAVACHOL) 40 MG tablet Take 1 tablet by mouth every evening.     . vitamin B-12 (CYANOCOBALAMIN) 1000 MCG tablet Take 1,000 mcg by mouth daily.     No current facility-administered medications on file prior to visit.     Past Medical History  Diagnosis Date  . Hypertension   . Bullous pemphigoid    Allergies  Allergen Reactions  . Atenolol Other (See Comments)    Drops heart rate  . Sulfa Antibiotics Other (See Comments)    Childhood allergy     . Adhesive [Tape] Rash  . Amlodipine Rash and Other (See Comments)    flushing    Social History   Social History  . Marital Status: Widowed    Spouse Name: N/A  . Number of Children: 1  . Years of Education: N/A   Social History Main Topics  . Smoking status: Never Smoker   . Smokeless tobacco: Never Used  . Alcohol Use: No  . Drug Use: No  . Sexual Activity: Not Asked   Other Topics Concern  . None   Social History Narrative    Filed Vitals:   01/19/16 1051  BP: 190/80  Pulse: 61  Temp: 97.5 F (36.4 C)  Resp: 12   Body mass index is 24.17 kg/(m^2).  Wt Readings from Last 3 Encounters:  01/19/16 131 lb (59.421 kg)  01/12/16 131 lb 9.6 oz (59.693 kg)  01/05/16 132 lb 11.2 oz (60.192 kg)     SpO2 Readings from Last 3 Encounters:  01/19/16 98%  01/15/16 98%  01/14/16 98%     Physical Exam  Constitutional: She is oriented to person, place, and time. She appears well-developed and well-nourished. She does not appear ill. No distress.  HENT:  Head: Atraumatic.  Eyes: Conjunctivae and EOM are normal. Pupils are equal, round, and reactive to light.  Neck: No JVD present. No tracheal deviation present. No thyroid mass and no thyromegaly present.  Cardiovascular: Normal rate and regular rhythm.   Pulses:      Dorsalis pedis pulses are 2+ on the right side, and 2+ on the left side.  Soft SEM RUSB  Respiratory: Effort normal and breath sounds normal. No respiratory distress.  GI: Soft. Bowel sounds are normal. She exhibits no abdominal bruit and no mass. There is no hepatomegaly. There is no tenderness.  Musculoskeletal: She exhibits no tenderness. Edema: 2+ LE edema no pitting, pedal edema pitting.       Right lower leg: She exhibits no tenderness.       Left lower leg: She exhibits no tenderness.  Lymphadenopathy:    She has no cervical adenopathy.  Neurological: She is alert and oriented to person, place, and time. She has normal strength. No cranial  nerve deficit or sensory deficit. Coordination and gait normal.  Stable gait with no assistance.  Skin: Skin is warm. No erythema.  Psychiatric: She has a normal mood and affect. Her speech is normal. Her mood appears not anxious.  Well groomed, good eye contact.      ASSESSMENT AND PLAN:     Darnelle was seen today for 2 week follow up.  Diagnoses and all orders for this visit:  Essential hypertension -     VAS US RENAL ARTERY DUPLEX; Future -     ECHO COMPLETE; Future -     Basic metabolic panel -     Microalbumin/Creatinine Ratio, Urine -  hydrALAZINE (APRESOLINE) 25 MG tablet; Take 2 tablets (50 mg total) by mouth 3 (three) times daily.  Bilateral lower extremity edema -     ECHO COMPLETE; Future -     Basic metabolic panel -     Microalbumin/Creatinine Ratio, Urine   Blood pressure is still poorly controlled, currently she is asymptomatic. She agrees with increasing dose of hydralazine from 25 mg 3 times a day to 50 mg 3 times a day. Rest of her medications unchanged for now. She was clearly instructed about warning signs, she will continue auditory her BP at home. We discussed possible causes of lower extremity edema, including medication. For now I recommended elevation of lower extremities. She has had increasing lab work within normal limits, today we'll repeat BMP and macroalbuminuria and/creatinine ratio. Renal ultrasound ordered to evaluate for renal artery stenosis. Echo ordered to evaluate for left ventricular ejection fraction, LVH, and presence of valvular disease. She will following 3 weeks, before if needed.      -She was advised to return or notify a doctor immediately if symptoms worsen or persist or new concerns arise, she voices understanding.       Preslea Rhodus G. Martinique, MD  [facility]. [place] office.

## 2016-01-21 ENCOUNTER — Telehealth: Payer: Self-pay | Admitting: Family Medicine

## 2016-01-21 NOTE — Telephone Encounter (Addendum)
;  Pt states she had an allergic reaction Pt states she had an allergic reaction to  hydrALAZINE (APRESOLINE) 25 MG tablet  Pt states after starting this med, her face started swelling, arms got red from elbows to fingers,   feet swelled but they did not turn red.   Pt states the worse thing she felt so bad. Pt called the pharmacy and they advised her to stop the medication, and to start taking benadryl.  Pt did stop the med. Pt continues to take the liquid benadryl 1 tsp/ every 4 hours  Pt states she feels fine now. But needs Dr Martinique to know and advis  [facility]/ [address]

## 2016-01-23 ENCOUNTER — Emergency Department (HOSPITAL_COMMUNITY): Payer: Medicare Other

## 2016-01-23 ENCOUNTER — Telehealth: Payer: Self-pay | Admitting: Family Medicine

## 2016-01-23 ENCOUNTER — Emergency Department (HOSPITAL_COMMUNITY)
Admission: EM | Admit: 2016-01-23 | Discharge: 2016-01-24 | Disposition: A | Discharge status: Home / Self Care | Payer: Medicare Other | Attending: Emergency Medicine | Admitting: Emergency Medicine

## 2016-01-23 ENCOUNTER — Encounter (HOSPITAL_COMMUNITY): Payer: Self-pay | Admitting: *Deleted

## 2016-01-23 ENCOUNTER — Ambulatory Visit (INDEPENDENT_AMBULATORY_CARE_PROVIDER_SITE_OTHER): Payer: Medicare Other | Admitting: Family Medicine

## 2016-01-23 ENCOUNTER — Other Ambulatory Visit: Payer: Self-pay

## 2016-01-23 ENCOUNTER — Other Ambulatory Visit: Payer: Self-pay | Admitting: Family Medicine

## 2016-01-23 ENCOUNTER — Encounter: Payer: Self-pay | Admitting: Family Medicine

## 2016-01-23 DIAGNOSIS — Z79899 Other long term (current) drug therapy: Secondary | ICD-10-CM | POA: Insufficient documentation

## 2016-01-23 DIAGNOSIS — E7800 Pure hypercholesterolemia, unspecified: Secondary | ICD-10-CM | POA: Diagnosis not present

## 2016-01-23 DIAGNOSIS — I159 Secondary hypertension, unspecified: Secondary | ICD-10-CM | POA: Diagnosis not present

## 2016-01-23 DIAGNOSIS — R251 Tremor, unspecified: Secondary | ICD-10-CM | POA: Diagnosis not present

## 2016-01-23 DIAGNOSIS — I10 Essential (primary) hypertension: Secondary | ICD-10-CM

## 2016-01-23 DIAGNOSIS — R51 Headache: Secondary | ICD-10-CM | POA: Diagnosis not present

## 2016-01-23 DIAGNOSIS — I160 Hypertensive urgency: Secondary | ICD-10-CM | POA: Diagnosis not present

## 2016-01-23 DIAGNOSIS — R030 Elevated blood-pressure reading, without diagnosis of hypertension: Secondary | ICD-10-CM | POA: Diagnosis not present

## 2016-01-23 LAB — CBC WITH DIFFERENTIAL/PLATELET
BASOS PCT: 1 %
Basophils Absolute: 0.1 10*3/uL (ref 0.0–0.1)
EOS ABS: 0.1 10*3/uL (ref 0.0–0.7)
Eosinophils Relative: 1 %
HCT: 41.4 % (ref 36.0–46.0)
HEMOGLOBIN: 14.8 g/dL (ref 12.0–15.0)
Lymphocytes Relative: 35 %
Lymphs Abs: 2.4 10*3/uL (ref 0.7–4.0)
MCH: 31.2 pg (ref 26.0–34.0)
MCHC: 35.7 g/dL (ref 30.0–36.0)
MCV: 87.2 fL (ref 78.0–100.0)
MONOS PCT: 11 %
Monocytes Absolute: 0.7 10*3/uL (ref 0.1–1.0)
NEUTROS PCT: 52 %
Neutro Abs: 3.6 10*3/uL (ref 1.7–7.7)
PLATELETS: 266 10*3/uL (ref 150–400)
RBC: 4.75 MIL/uL (ref 3.87–5.11)
RDW: 12.4 % (ref 11.5–15.5)
WBC: 6.8 10*3/uL (ref 4.0–10.5)

## 2016-01-23 LAB — COMPREHENSIVE METABOLIC PANEL
ALBUMIN: 4.2 g/dL (ref 3.5–5.0)
ALK PHOS: 54 U/L (ref 38–126)
ALT: 21 U/L (ref 14–54)
ANION GAP: 10 (ref 5–15)
AST: 25 U/L (ref 15–41)
BUN: 14 mg/dL (ref 6–20)
CALCIUM: 9.7 mg/dL (ref 8.9–10.3)
CHLORIDE: 99 mmol/L — AB (ref 101–111)
CO2: 24 mmol/L (ref 22–32)
CREATININE: 0.99 mg/dL (ref 0.44–1.00)
GFR calc Af Amer: >60 mL/min (ref >60)
GFR calc non Af Amer: 53 mL/min — ABNORMAL LOW (ref >60)
Glucose, Bld: 104 mg/dL — ABNORMAL HIGH (ref 65–99)
Potassium: 3.4 mmol/L — ABNORMAL LOW (ref 3.5–5.1)
SODIUM: 133 mmol/L — AB (ref 135–145)
TOTAL PROTEIN: 6.8 g/dL (ref 6.5–8.1)
Total Bilirubin: 1.1 mg/dL (ref 0.3–1.2)

## 2016-01-23 LAB — URINALYSIS, ROUTINE W REFLEX MICROSCOPIC
BILIRUBIN URINE: NEGATIVE
Glucose, UA: NEGATIVE mg/dL
HGB URINE DIPSTICK: NEGATIVE
KETONES UR: NEGATIVE mg/dL
Leukocytes, UA: NEGATIVE
NITRITE: NEGATIVE
PROTEIN: NEGATIVE mg/dL
Specific Gravity, Urine: 1.009 (ref 1.005–1.030)
pH: 8.5 — ABNORMAL HIGH (ref 5.0–8.0)

## 2016-01-23 LAB — TROPONIN I

## 2016-01-23 MED ORDER — PRAZOSIN HCL 2 MG PO CAPS: 2.0000 mg | ORAL_CAPSULE | Freq: Every day | ORAL | Status: DC

## 2016-01-23 NOTE — ED Notes (Signed)
X ray at bedsid

## 2016-01-23 NOTE — Progress Notes (Signed)
Pre visit review using our clinic review tool, if applicable. No additional management support is needed unless otherwise documented below in the visit note. 

## 2016-01-23 NOTE — Progress Notes (Addendum)
Amber Baird is a 81 y.o.female, who is here today to follow on HTN.  She is c/o "feeling bad", mainly at night time, she has reported this in prior office visits but she cannot specify symptoms.  She has not tolerated some antihypertensive medications, last one added was Hydralazine and discontinued because facial edema and rash, resolved with Benadryl.  BP readings at home 217/90-100.  Currently she is on Carvedilol 6.25 mg half tablet twice per day, Lisinopril 120 mg daily, and Hydrochlorthiazide 25 mg daily. Beta blocker was decreased 2-3 weeks ago because bradycardia in the 40s and low 50s.  She taking medications as instructed.  She has not noted unusual headache, visual changes, exertional chest pain, dyspnea,  focal weakness, or worsening edema. Lower extremity edema improved greatly after she stopped the hydralazine.  + Palpitation, which she has had for a few months nos but last night it was worse and lasted longed , she did not check HR.  She feels anxious when her blood pressure is elevated, her sister is no longer with her so she stayed by herself.   Lab Results  Component Value Date   CREATININE 1.00 01/19/2016   BUN 18 01/19/2016   NA 137 01/19/2016   K 4.0 01/19/2016   CL 99 01/19/2016   CO2 27 01/19/2016   Flushing sensation, sudden onset about 3 weeks ago and several times during the day, face and upper extremities get red, getting worse for the past week or so. Then she feels cold, "freeze to death". Flushing sensation lasts 2-3 min and cold feeling for a few hours.  Renal Dupplex and echo are pending to evaluate for secondary HTN.  Lab Results  Component Value Date   TSH 2.27 01/05/2016   Lab Results  Component Value Date   WBC 7.2 01/13/2016   HGB 14.8 01/13/2016   HCT 40.8 01/13/2016   MCV 87.6 01/13/2016   PLT 271 01/13/2016   Renin and Aldosterone in normal range.  Review of Systems  Constitutional: Positive for fatigue. Negative  for fever, appetite change and unexpected weight change.  HENT: Negative for mouth sores, nosebleeds, sore throat and trouble swallowing.   Eyes: Negative for pain, redness and visual disturbance.  Respiratory: Negative for cough, shortness of breath and wheezing.   Cardiovascular: Negative for chest pain, palpitations and leg swelling.  Gastrointestinal: Negative for nausea, vomiting and abdominal pain.       Negative for changes in bowel habits.  Genitourinary: Negative for dysuria, hematuria, decreased urine volume and difficulty urinating.  Skin: Negative for color change and rash.  Neurological: Positive for light-headedness. Negative for tremors, seizures, syncope, facial asymmetry, speech difficulty, weakness, numbness and headaches.  Psychiatric/Behavioral: Negative for hallucinations and confusion. The patient is nervous/anxious.      Current Outpatient Prescriptions on File Prior to Visit  Medication Sig Dispense Refill  . Biotin (BIOTIN 5000) 5 MG CAPS Take 1 capsule by mouth daily.    . carvedilol (COREG) 6.25 MG tablet Take 0.5 tablets by mouth 2 (two) times daily. decreased    . Cholecalciferol (VITAMIN D-3) 1000 units CAPS Take 1 capsule by mouth daily.    Marland Kitchen gabapentin (NEURONTIN) 300 MG capsule Take 1 capsule by mouth every evening.     . hydrochlorothiazide (HYDRODIURIL) 25 MG tablet Take 25 mg by mouth daily.    Marland Kitchen lisinopril (PRINIVIL,ZESTRIL) 20 MG tablet Take 20 mg by mouth daily.     . Omega-3 Fatty Acids (FISH OIL) 1200  MG CAPS Take 1 capsule by mouth daily.    . pravastatin (PRAVACHOL) 40 MG tablet Take 1 tablet by mouth every evening.     . vitamin B-12 (CYANOCOBALAMIN) 1000 MCG tablet Take 1,000 mcg by mouth daily.     No current facility-administered medications on file prior to visit.     Past Medical History  Diagnosis Date  . Hypertension   . Bullous pemphigoid     Allergies  Allergen Reactions  . Atenolol Other (See Comments)    Drops heart rate    . Hydralazine Hcl Swelling and Other (See Comments)    Swelling on face, arms red from elbow to fingers.  . Sulfa Antibiotics Other (See Comments)    Childhood allergy   . Adhesive [Tape] Rash  . Amlodipine Rash and Other (See Comments)    flushing    Social History   Social History  . Marital Status: Widowed    Spouse Name: N/A  . Number of Children: 1  . Years of Education: N/A   Social History Main Topics  . Smoking status: Never Smoker   . Smokeless tobacco: Never Used  . Alcohol Use: No  . Drug Use: No  . Sexual Activity: Not Asked   Other Topics Concern  . None   Social History Narrative    Filed Vitals:   01/23/16 0923  BP: 200/86  Pulse: 65  Temp: 97.8 F (36.6 C)  Resp: 12   Body mass index is 24 kg/(m^2).  SpO2 Readings from Last 3 Encounters:  01/23/16 99%  01/19/16 98%  01/15/16 98%      Physical Exam  Constitutional: She is oriented to person, place, and time. She appears well-developed and well-nourished. No distress.  HENT:  Head: Atraumatic.  Eyes: Conjunctivae and EOM are normal. Pupils are equal, round, and reactive to light.  Cardiovascular: Normal rate and regular rhythm.   Pulses:      Dorsalis pedis pulses are 2+ on the right side, and 2+ on the left side.  SEM I/VI heard on RUSB  Respiratory: Effort normal and breath sounds normal. No respiratory distress.  GI: Soft. Bowel sounds are normal. She exhibits no abdominal bruit and no mass. There is no hepatomegaly. There is no tenderness.  Musculoskeletal: She exhibits edema (Trace pedal edema bilateral, pitting). She exhibits no tenderness.  Neurological: She is alert and oriented to person, place, and time. She has normal strength. No cranial nerve deficit or sensory deficit. Coordination and gait normal.  Stable gait with no assistance.  Skin: Skin is warm. No erythema.  Psychiatric: Her speech is normal. Her mood appears anxious.  Well groomed, good eye contact.    ASSESSMENT  AND PLAN:   Amber Baird was seen today for hypertension.  Diagnoses and all orders for this visit:  Secondary hypertension, unspecified  Symptoms she is describing today raise the concern about secondary hypertension: Renal artery stenosis versus pheochromocytoma, she is having lab done today and a abdominal CT with and without contrast, renal protocol was arranged for Monday, June 26.  She still has echocardiogram pending and renal ultrasound can be canceled since she is already having abdominal CT with renal protocol. She was clearly instructed about warning signs, she voices understanding.  -     Metanephrines, plasma  CT ABDOMEN PELVIS W WO CONTRAST; Future   Severe hypertension  Still poorly controlled but otherwise stable, today no signs of hypertensive emergency. She was clearly instructed about warning signs, she voices understanding.  She  will continue Hydrochlorothiazide and Carvedilol at current dose, for now I don't want to change dose of Lisinopril until renal artery stenosis is ruled out.  We discussed possible choices: Clonidine,Spironolactone, or prazosin. I sent the latter one to the pharmacy, she will keep this appointment, 1-2 weeks,sooner if needed.  -     Metanephrines, plasma -     prazosin (MINIPRESS) 2 MG capsule; Take 1 capsule (2 mg total) by mouth at bedtime.      -She was advised to return sooner than planned today if new concerns arise.     Contessa Preuss G. Martinique, MD  [facility]. [place] office.

## 2016-01-23 NOTE — Discharge Instructions (Signed)
Please follow up with your doctor for further management of your high blood pressure.  Continue taking the blood pressure medication recently prescribed to you.  Follow up with neurology for evaluation of your confusion and leg discomfort.  Return to the ER if you have any concerns.    Managing Your High Blood Pressure Blood pressure is a measurement of how forceful your blood is pressing against the walls of the arteries. Arteries are muscular tubes within the circulatory system. Blood pressure does not stay the same. Blood pressure rises when you are active, excited, or nervous; and it lowers during sleep and relaxation. If the numbers measuring your blood pressure stay above normal most of the time, you are at risk for health problems. High blood pressure (hypertension) is a long-term (chronic) condition in which blood pressure is elevated. A blood pressure reading is recorded as two numbers, such as 120 over 80 (or 120/80). The first, higher number is called the systolic pressure. It is a measure of the pressure in your arteries as the heart beats. The second, lower number is called the diastolic pressure. It is a measure of the pressure in your arteries as the heart relaxes between beats.  Keeping your blood pressure in a normal range is important to your overall health and prevention of health problems, such as heart disease and stroke. When your blood pressure is uncontrolled, your heart has to work harder than normal. High blood pressure is a very common condition in adults because blood pressure tends to rise with age. Men and women are equally likely to have hypertension but at different times in life. Before age 27, men are more likely to have hypertension. After 79 years of age, women are more likely to have it. Hypertension is especially common in African Americans. This condition often has no signs or symptoms. The cause of the condition is usually not known. Your caregiver can help you come up  with a plan to keep your blood pressure in a normal, healthy range. BLOOD PRESSURE STAGES Blood pressure is classified into four stages: normal, prehypertension, stage 1, and stage 2. Your blood pressure reading will be used to determine what type of treatment, if any, is necessary. Appropriate treatment options are tied to these four stages:  Normal  Systolic pressure (mm Hg): below 120.  Diastolic pressure (mm Hg): below 80. Prehypertension  Systolic pressure (mm Hg): 120 to 139.  Diastolic pressure (mm Hg): 80 to 89. Stage1  Systolic pressure (mm Hg): 140 to 159.  Diastolic pressure (mm Hg): 90 to 99. Stage2  Systolic pressure (mm Hg): 160 or above.  Diastolic pressure (mm Hg): 100 or above. RISKS RELATED TO HIGH BLOOD PRESSURE Managing your blood pressure is an important responsibility. Uncontrolled high blood pressure can lead to:  A heart attack.  A stroke.  A weakened blood vessel (aneurysm).  Heart failure.  Kidney damage.  Eye damage.  Metabolic syndrome.  Memory and concentration problems. HOW TO MANAGE YOUR BLOOD PRESSURE Blood pressure can be managed effectively with lifestyle changes and medicines (if needed). Your caregiver will help you come up with a plan to bring your blood pressure within a normal range. Your plan should include the following: Education  Read all information provided by your caregivers about how to control blood pressure.  Educate yourself on the latest guidelines and treatment recommendations. New research is always being done to further define the risks and treatments for high blood pressure. Lifestylechanges  Control your weight.  Avoid  smoking.  Stay physically active.  Reduce the amount of salt in your diet.  Reduce stress.  Control any chronic conditions, such as high cholesterol or diabetes.  Reduce your alcohol intake. Medicines  Several medicines (antihypertensive medicines) are available, if needed, to  bring blood pressure within a normal range. Communication  Review all the medicines you take with your caregiver because there may be side effects or interactions.  Talk with your caregiver about your diet, exercise habits, and other lifestyle factors that may be contributing to high blood pressure.  See your caregiver regularly. Your caregiver can help you create and adjust your plan for managing high blood pressure. RECOMMENDATIONS FOR TREATMENT AND FOLLOW-UP  The following recommendations are based on current guidelines for managing high blood pressure in nonpregnant adults. Use these recommendations to identify the proper follow-up period or treatment option based on your blood pressure reading. You can discuss these options with your caregiver.  Systolic pressure of 123456 to [id number] or diastolic pressure of 80 to 89: Follow up with your caregiver as directed.  Systolic pressure of [id number] to 0000000 or diastolic pressure of 90 to 100: Follow up with your caregiver within 2 months.  Systolic pressure above 0000000 or diastolic pressure above [id number]: Follow up with your caregiver within 1 month.  Systolic pressure above [id number] or diastolic pressure above [id number]: Consider antihypertensive therapy; follow up with your caregiver within 1 week.  Systolic pressure above [id number] or diastolic pressure above 123456: Begin antihypertensive therapy; follow up with your caregiver within 1 week.   This information is not intended to replace advice given to you by your health care provider. Make sure you discuss any questions you have with your health care provider.   Document Released: 04/12/2012 Document Reviewed: 04/12/2012 Elsevier Interactive Patient Education [employer].

## 2016-01-23 NOTE — ED Notes (Signed)
Pt reports intermittent h/a with elevated BP, went to [facility] ED 10 days ago because "it's spiked."  Pt reports it's always been high but states her DBP was 135 10 days ago.  Pt also reports recently developing intermittent tremors.  Pt is A&x 4.  No obvious neuro deficits noted.

## 2016-01-23 NOTE — ED Notes (Signed)
Per EMS, pt from home, reports hypertension.  Was seen 10 days ago at [facility] ED for same.  Pt also reports intermittent h/a, started to have tremors today, states she feels like she is getting ready to have a migraine.  Was seen at her PCP's office and given rx for HTN but has not taken it.

## 2016-01-23 NOTE — Addendum Note (Signed)
Addended by: Martinique, BETTY G on: 01/23/2016 12:42 PM   Modules accepted: Miquel Dunn

## 2016-01-23 NOTE — Telephone Encounter (Signed)
She received a message to call about her CT scan

## 2016-01-23 NOTE — ED Notes (Signed)
Pt still in MRI 

## 2016-01-23 NOTE — Telephone Encounter (Signed)
ERROR

## 2016-01-23 NOTE — ED Provider Notes (Signed)
CSN: [id number]     Arrival date & time 01/23/16  1847 History   First MD Initiated Contact with Patient 01/23/16 1901     Chief Complaint  Patient presents with  . Hypertension     (Consider location/radiation/quality/duration/timing/severity/associated sxs/prior Treatment) HPI   81 year old female with history of uncontrolled hypertension presenting today via EMS from home for evaluation of high blood pressure. Patient reports she has had persistent high blood pressure usually above 200 for the past several months. She has been switching her medication and changing her primary care doctor try to find a way to have a blood pressure in better control. She recently started seeing a new provider within the past month, last visit was today. She was placed in a new blood pressure medication but has not taken it yet. She is here today due to a sensation of lightheadedness along with intermittent body tremors. She also report that her L leg is "feeling funny and heavy".  "something is going on in my head".  History is difficult to obtain as patient became lethargic and unable to answer questions appropriately. Daughter who is at bedside states that patient's tremors and new confusion is different from her baseline. Patient has been compliant with her medication. Pt currently taking Coreg, Hydrodiuril, and Lisinopril.  Patient did report intermittent throbbing headache but none recently. She denies having chest pain, shortness of breath, abdominal pain, new focal numbness. Last normal was yesterday. Pt denies having any back pain.   Past Medical History  Diagnosis Date  . Hypertension   . Bullous pemphigoid    Past Surgical History  Procedure Laterality Date  . Breast biopsy  1980  . Appendectomy  1980  . Vaginal hysterectomy  1980   Family History  Problem Relation Age of Onset  . Arthritis    . High Cholesterol Mother   . Heart disease Mother   . Hypertension Mother    Social History   Substance Use Topics  . Smoking status: Never Smoker   . Smokeless tobacco: Never Used  . Alcohol Use: No   OB History    No data available     Review of Systems  Unable to perform ROS: Mental status change      Allergies  Atenolol; Hydralazine hcl; Sulfa antibiotics; Adhesive; and Amlodipine  Home Medications   Prior to Admission medications   Medication Sig Start Date End Date Taking? Authorizing Provider  Biotin (BIOTIN 5000) 5 MG CAPS Take 1 capsule by mouth daily.    Historical Provider, MD  carvedilol (COREG) 6.25 MG tablet Take 0.5 tablets by mouth 2 (two) times daily. decreased 12/31/15   Historical Provider, MD  Cholecalciferol (VITAMIN D-3) 1000 units CAPS Take 1 capsule by mouth daily.    Historical Provider, MD  gabapentin (NEURONTIN) 300 MG capsule Take 1 capsule by mouth every evening.  08/20/15   Historical Provider, MD  hydrochlorothiazide (HYDRODIURIL) 25 MG tablet Take 25 mg by mouth daily. 11/24/15   Historical Provider, MD  lisinopril (PRINIVIL,ZESTRIL) 20 MG tablet Take 20 mg by mouth daily.  08/19/15   Historical Provider, MD  Omega-3 Fatty Acids (FISH OIL) 1200 MG CAPS Take 1 capsule by mouth daily.    Historical Provider, MD  pravastatin (PRAVACHOL) 40 MG tablet Take 1 tablet by mouth every evening.  10/01/15   Historical Provider, MD  prazosin (MINIPRESS) 2 MG capsule Take 1 capsule (2 mg total) by mouth at bedtime. 01/23/16   Betty G Martinique, MD  vitamin B-12 (  CYANOCOBALAMIN) 1000 MCG tablet Take 1,000 mcg by mouth daily.    Historical Provider, MD   BP 194/70 mmHg  Pulse 67  Temp(Src) 98.1 F (36.7 C) (Oral)  Resp 18  Ht 5\' 2"  (1.575 m)  Wt 58.968 kg  BMI 23.77 kg/m2  SpO2 100% Physical Exam  Constitutional: She is oriented to person, place, and time. She appears well-developed and well-nourished. She appears lethargic. No distress.  Caucasian female, appears drowsy, slurring of speech  HENT:  Head: Normocephalic and atraumatic.  Eyes: Conjunctivae  and EOM are normal. Pupils are equal, round, and reactive to light.  Neck: Normal range of motion. Neck supple.  No nuchal rigidity no carotid bruit  Cardiovascular: Normal rate and regular rhythm.   Pulmonary/Chest: Effort normal and breath sounds normal. No respiratory distress. She has no wheezes.  Abdominal: Soft. There is no tenderness.  Musculoskeletal: She exhibits no tenderness (no midline spine tenderness).  Neurological: She is oriented to person, place, and time. She has normal strength. She appears lethargic. No cranial nerve deficit or sensory deficit. GCS eye subscore is 4. GCS verbal subscore is 5. GCS motor subscore is 6.  Gait not tested.  Skin: No rash noted.  Psychiatric: She has a normal mood and affect.  Nursing note and vitals reviewed.   ED Course  Procedures (including critical care time) Labs Review Labs Reviewed  COMPREHENSIVE METABOLIC PANEL - Abnormal; Notable for the following:    Sodium 133 (*)    Potassium 3.4 (*)    Chloride 99 (*)    Glucose, Bld 104 (*)    GFR calc non Af Amer 53 (*)    All other components within normal limits  URINALYSIS, ROUTINE W REFLEX MICROSCOPIC (NOT AT [facility]) - Abnormal; Notable for the following:    pH 8.5 (*)    All other components within normal limits  CBC WITH DIFFERENTIAL/PLATELET  TROPONIN I    Imaging Review Ct Head Wo Contrast  01/23/2016  CLINICAL DATA:  Acute onset of intermittent headache and high blood pressure. Intermittent tremors. Initial encounter. EXAM: CT HEAD WITHOUT CONTRAST TECHNIQUE: Contiguous axial images were obtained from the base of the skull through the vertex without intravenous contrast. COMPARISON:  CT of the head performed 01/14/2016 FINDINGS: There is no evidence of acute infarction, mass lesion, or intra- or extra-axial hemorrhage on CT. Prominence of the ventricles and sulci reflects mild cortical volume loss. Mild cerebellar atrophy is noted. Mild periventricular white matter change likely  reflects small vessel ischemic microangiopathy. A small chronic lacunar infarct is noted at the left basal ganglia. The brainstem and fourth ventricle are within normal limits. The cerebral hemispheres demonstrate grossly normal gray-white differentiation. No mass effect or midline shift is seen. There is no evidence of fracture; visualized osseous structures are unremarkable in appearance. The orbits are within normal limits. The paranasal sinuses and mastoid air cells are well-aerated. No significant soft tissue abnormalities are seen. IMPRESSION: 1. No acute intracranial pathology seen on CT. 2. Mild cortical volume loss and scattered small vessel ischemic microangiopathy. 3. Small chronic lacunar infarct at the left basal ganglia. Electronically Signed   By: Garald Balding M.D.   On: 01/23/2016 20:49   Mr Brain Wo Contrast  01/23/2016  CLINICAL DATA:  Intermittent headache for 10 days, tremors beginning today. Pre migraine sensation. History of hypertension. EXAM: MRI HEAD WITHOUT CONTRAST TECHNIQUE: Multiplanar, multiecho pulse sequences of the brain and surrounding structures were obtained without intravenous contrast. COMPARISON:  CT HEAD January 23, 2016 at 2023 hours FINDINGS: INTRACRANIAL CONTENTS: No reduced diffusion to suggest acute ischemia. No susceptibility artifact to suggest hemorrhage. The ventricles and sulci are normal for patient's age. No suspicious parenchymal signal, masses or mass effect. No abnormal extra-axial fluid collections. Scattered subcentimeter supratentorial white matter FLAIR T2 hyperintensities are less than expected for age (which can be seen with chronic small vessel ischemic disease and/or migraine). No extra-axial masses though, contrast enhanced sequences would be more sensitive. Normal major intracranial vascular flow voids present at skull base. ORBITS: The included ocular globes and orbital contents are non-suspicious. SINUSES: The mastoid air-cells and included  paranasal sinuses are well-aerated. SKULL/SOFT TISSUES: No abnormal sellar expansion. No suspicious calvarial bone marrow signal. Craniocervical junction maintained. IMPRESSION: Negative MRI head for age. Electronically Signed   By: Elon Alas M.D.   On: 01/23/2016 22:40   Dg Chest Portable 1 View  01/23/2016  CLINICAL DATA:  Hypertension, tremor, near syncope EXAM: PORTABLE CHEST 1 VIEW COMPARISON:  Portable exam 1935 hours compared to 08/03/2006 FINDINGS: Normal heart size and pulmonary vascularity. Small hiatal hernia. Mediastinal contours otherwise normal. Lungs appear hyperinflated but clear. No pulmonary infiltrate, pleural effusion or pneumothorax. Osseous demineralization. IMPRESSION: Question emphysematous changes. Small hiatal hernia. No acute abnormalities. Electronically Signed   By: Lavonia Dana M.D.   On: 01/23/2016 20:26   I have personally reviewed and evaluated these images and lab results as part of my medical decision-making.   EKG Interpretation None     ED ECG REPORT   Date: 01/23/2016  Rate:61  Rhythm: normal sinus rhythm  QRS Axis: normal  Intervals: normal  ST/T Wave abnormalities: nonspecific T wave changes  Conduction Disutrbances:nonspecific intraventricular conduction delay  Narrative Interpretation:   Old EKG Reviewed: unchanged  I have personally reviewed the EKG tracing and agree with the computerized printout as noted.   MDM   Final diagnoses:  Hypertensive urgency    BP 189/58 mmHg  Pulse 60  Temp(Src) 98.2 F (36.8 C) (Oral)  Resp 18  Ht 5\' 2"  (1.575 m)  Wt 58.968 kg  BMI 23.77 kg/m2  SpO2 100%   7:34 PM Pt with hx of uncontrolled HTN, here with c/o near syncope, body tremors, L leg weakness and "funny sensation in my head".  Her current BP is 194/70.  No active headache and no sudden onset thunderclap headache to suggest SAH.  No fever, or nuchal rigidity to suggest meningitis.  No active CP. No appreciable focal weakness or tremor  during exam. Due to her confusion and lethargy, head ct scan and work up initiated. Pt was seen in the ER for similar complaint 10 days ago.  Her BP improves after monitoring.    10:25 PM Blood pressure improves without any specific treatment. Labs are reassuring. EKG without acute ischemic changes, normal troponin. Chest x-ray with out focal infiltrate concerning for pneumonia. UA without signs of infection. Head CT scan is reassuring. Brain MRI is negative. Discussed with Dr. Alvino Chapel.  Domenic Moras, PA-C 01/23/16 2318  Davonna Belling, MD 01/24/16 [phone]

## 2016-01-23 NOTE — ED Notes (Signed)
Bed: WA21 Expected date:  Expected time:  Means of arrival:  Comments: 80 yo hypertension;tremors

## 2016-01-26 ENCOUNTER — Encounter: Payer: Self-pay | Admitting: Radiology

## 2016-01-26 ENCOUNTER — Ambulatory Visit: Payer: Medicare Other | Admitting: Family Medicine

## 2016-01-26 ENCOUNTER — Telehealth: Payer: Self-pay | Admitting: Family Medicine

## 2016-01-26 ENCOUNTER — Ambulatory Visit (INDEPENDENT_AMBULATORY_CARE_PROVIDER_SITE_OTHER)
Admission: RE | Admit: 2016-01-26 | Discharge: 2016-01-26 | Disposition: A | Discharge status: Home / Self Care | Payer: Medicare Other | Source: Ambulatory Visit | Attending: Family Medicine | Admitting: Family Medicine

## 2016-01-26 DIAGNOSIS — I159 Secondary hypertension, unspecified: Secondary | ICD-10-CM

## 2016-01-26 DIAGNOSIS — I700 Atherosclerosis of aorta: Secondary | ICD-10-CM | POA: Diagnosis not present

## 2016-01-26 MED ORDER — IOPAMIDOL (ISOVUE-370) INJECTION 76%
100.0000 mL | Freq: Once | INTRAVENOUS | Status: AC | PRN
  Administered 2016-01-26: 100 mL via INTRAVENOUS

## 2016-01-26 NOTE — Telephone Encounter (Signed)
Pt would like to know since she had the test this am, CT ANGIO ABDOMEN W/CM &/OR WO  Which included the renal, does she have to go to the appointment on [facility] RENAL  Her understanding, the test today included all Dr Martinique needs.  Please advise.

## 2016-01-26 NOTE — Progress Notes (Unsigned)
Patient came for routine CT Abdomen to evaluate for Pheochromocytoma vs RAS. Post contrast injection the patient developed intermittent tremors that lasted approximately 2 hours. BP taken was 189/89.  Pt given water and crackers. Rested in recliner until symptoms subsided.

## 2016-01-27 LAB — METANEPHRINES, PLASMA
Metanephrine, Free: 76 pg/mL — ABNORMAL HIGH (ref <=57)
NORMETANEPHRINE FREE: 380 pg/mL — AB (ref <=148)
TOTAL METANEPHRINES-PLASMA: 456 pg/mL — AB (ref <=205)

## 2016-01-28 ENCOUNTER — Ambulatory Visit (HOSPITAL_COMMUNITY)
Admission: RE | Admit: 2016-01-28 | Payer: Medicare Other | Source: Ambulatory Visit | Attending: Family Medicine | Admitting: Family Medicine

## 2016-01-28 ENCOUNTER — Telehealth: Payer: Self-pay | Admitting: Family Medicine

## 2016-01-28 ENCOUNTER — Other Ambulatory Visit: Payer: Self-pay | Admitting: Family Medicine

## 2016-01-28 MED ORDER — LISINOPRIL 40 MG PO TABS: 40.0000 mg | ORAL_TABLET | Freq: Every day | ORAL | Status: DC

## 2016-01-28 MED ORDER — OLMESARTAN MEDOXOMIL 20 MG PO TABS: 20.0000 mg | ORAL_TABLET | Freq: Every day | ORAL | Status: DC

## 2016-01-28 NOTE — Telephone Encounter (Signed)
New rx sent to pharmacy. Pt is aware.

## 2016-01-28 NOTE — Telephone Encounter (Signed)
Refill sent to pharmacy.   

## 2016-02-02 ENCOUNTER — Telehealth: Payer: Self-pay | Admitting: Family Medicine

## 2016-02-02 NOTE — Telephone Encounter (Signed)
Pt wanted to make you aware that the Rx prazosin she is not able to take it due to having diarrhea more than a few days a week it is every day sometime more than 3 times.  What is it that you want her to do wait until 7/10 or what?

## 2016-02-04 NOTE — Telephone Encounter (Signed)
Diarrhea can happen with Prazosin but usually resolves after a few weeks. She did not tolerate Hydralazine and cannot take betablocker due to bradycardia.  We just increased Lisinopril a few days ago, so we can discuss other antihypertensive medications during follow up.   Thanks, BJ

## 2016-02-04 NOTE — Telephone Encounter (Signed)
Informed pt of your response. Pt did say that she was kept up two nights with the symptoms and will talk with you on Monday at her appointment with you.

## 2016-02-05 ENCOUNTER — Ambulatory Visit (HOSPITAL_COMMUNITY): Payer: Medicare Other | Attending: Internal Medicine

## 2016-02-05 ENCOUNTER — Other Ambulatory Visit: Payer: Self-pay

## 2016-02-05 ENCOUNTER — Other Ambulatory Visit: Payer: Medicare Other

## 2016-02-05 DIAGNOSIS — I358 Other nonrheumatic aortic valve disorders: Secondary | ICD-10-CM | POA: Diagnosis not present

## 2016-02-05 DIAGNOSIS — I340 Nonrheumatic mitral (valve) insufficiency: Secondary | ICD-10-CM | POA: Insufficient documentation

## 2016-02-05 DIAGNOSIS — I351 Nonrheumatic aortic (valve) insufficiency: Secondary | ICD-10-CM | POA: Insufficient documentation

## 2016-02-05 DIAGNOSIS — I10 Essential (primary) hypertension: Secondary | ICD-10-CM | POA: Diagnosis not present

## 2016-02-05 DIAGNOSIS — I119 Hypertensive heart disease without heart failure: Secondary | ICD-10-CM | POA: Diagnosis not present

## 2016-02-05 DIAGNOSIS — R600 Localized edema: Secondary | ICD-10-CM

## 2016-02-05 DIAGNOSIS — I071 Rheumatic tricuspid insufficiency: Secondary | ICD-10-CM | POA: Insufficient documentation

## 2016-02-05 DIAGNOSIS — R011 Cardiac murmur, unspecified: Secondary | ICD-10-CM | POA: Diagnosis present

## 2016-02-05 LAB — ECHOCARDIOGRAM COMPLETE
CHL CUP MV DEC (S): 250
E decel time: 250 msec
EERAT: 10.98
FS: 50 % — AB (ref 28–44)
IVS/LV PW RATIO, ED: 1.03
LA ID, A-P, ES: 39 mm
LA vol A4C: 33 ml
LA vol: 37 mL
LADIAMINDEX: 2.41 cm/m2
LAVOLIN: 22.9 mL/m2
LDCA: 2.01 cm2
LEFT ATRIUM END SYS DIAM: 39 mm
LV PW d: 9.11 mm — AB (ref 0.6–1.1)
LV TDI E'LATERAL: 8.99
LV TDI E'MEDIAL: 6.03
LVEEAVG: 10.98
LVEEMED: 10.98
LVELAT: 8.99 cm/s
LVOT VTI: 36.1 cm
LVOT diameter: 16 mm
LVOT peak grad rest: 9 mmHg
LVOTPV: 149 cm/s
LVOTSV: 73 mL
MV pk E vel: 98.7 m/s
MVPG: 4 mmHg
MVPKAVEL: 121 m/s
P 1/2 time: 548 ms
Reg peak vel: 290 cm/s
TR max vel: 290 cm/s

## 2016-02-09 ENCOUNTER — Encounter: Payer: Self-pay | Admitting: Family Medicine

## 2016-02-09 ENCOUNTER — Ambulatory Visit (INDEPENDENT_AMBULATORY_CARE_PROVIDER_SITE_OTHER): Payer: Medicare Other | Admitting: Family Medicine

## 2016-02-09 ENCOUNTER — Other Ambulatory Visit: Payer: Self-pay | Admitting: Family Medicine

## 2016-02-09 VITALS — BP 165/65 | HR 54 | Temp 97.7°F | Resp 12 | Ht 62.0 in | Wt 129.0 lb

## 2016-02-09 DIAGNOSIS — I10 Essential (primary) hypertension: Secondary | ICD-10-CM

## 2016-02-09 DIAGNOSIS — I7300 Raynaud's syndrome without gangrene: Secondary | ICD-10-CM

## 2016-02-09 DIAGNOSIS — R001 Bradycardia, unspecified: Secondary | ICD-10-CM

## 2016-02-09 DIAGNOSIS — R5383 Other fatigue: Secondary | ICD-10-CM

## 2016-02-09 MED ORDER — LISINOPRIL 40 MG PO TABS: 40.0000 mg | ORAL_TABLET | Freq: Every day | ORAL | Status: DC

## 2016-02-09 MED ORDER — CARVEDILOL 6.25 MG PO TABS: 3.1250 mg | ORAL_TABLET | Freq: Every day | ORAL | Status: DC

## 2016-02-09 MED ORDER — SPIRONOLACTONE 25 MG PO TABS: 25.0000 mg | ORAL_TABLET | Freq: Every day | ORAL | Status: DC

## 2016-02-09 NOTE — Patient Instructions (Signed)
A few things to remember from today's visit:   Bradycardia, sinus  Essential hypertension - Plan: spironolactone (ALDACTONE) 25 MG tablet, carvedilol (COREG) 6.25 MG tablet, lisinopril (PRINIVIL,ZESTRIL) 40 MG tablet, Basic Metabolic Panel  BP [id number].  Lab in 3 weeks. Coreg decreased to once daily.   Please be sure medication list is accurate. If a new problem present, please set up appointment sooner than planned today.

## 2016-02-09 NOTE — Progress Notes (Signed)
Pre visit review using our clinic review tool, if applicable. No additional management support is needed unless otherwise documented below in the visit note. 

## 2016-02-09 NOTE — Telephone Encounter (Signed)
90 day rx sent in.

## 2016-02-09 NOTE — Progress Notes (Signed)
[name] is a 81 y.o.female, who is here today to follow on HTN.   Currently she is on HCTZ 25 mg, Lisinopril 40 mg. She has tried Hydroxyzine,Amlodipine, and Prazosin but has not tolerated these meds well.  Several ER visit because headache and body tremors, occasional palpitations. Last ER visit 01/23/16.   She reports BP reading 143/73 when she was on Prazosin but it caused severe diarrhea.   She taking medications as instructed, no side effects reported.  She has not noted unusual headache, visual changes, exertional chest pain, dyspnea,  focal weakness, or edema.  Hx of raynaud synd affecting toes, no toe pain or claudication.     Lab Results  Component Value Date   CREATININE 0.99 01/23/2016   BUN 14 01/23/2016   NA 133* 01/23/2016   K 3.4* 01/23/2016   CL 99* 01/23/2016   CO2 24 01/23/2016   She has had extensive work -up to evaluate for other causes of HTN.  Lab Results  Component Value Date   TSH 2.27 01/05/2016   Lab Results  Component Value Date   WBC 6.8 01/23/2016   HGB 14.8 01/23/2016   HCT 41.4 01/23/2016   MCV 87.2 01/23/2016   PLT 266 01/23/2016    She states that she had a "reaction" to contrast media given for CT, states that after IV dye she developed tremors, which she has had intermittently for the past few months and required ER visits.  Metanephrine in plasma mildly elevated (free and total) but since CT angiogram was not suggestive of pheochromocytoma, I did not order urine levels. Renin and aldosterone in normal range.   Echo 02/05/16:LVEF 65-70%, normal wall thickness, normal wall motion, diastolic   dysfunction, indeterminate LV filling pressure, aortic sclerosis   with trivial AI, trivial MR, moderate TR, RVSP 37 mmHg, normal   IVC.     Abdominal angio CT:01/26/16,  1.Negative for renal artery stenosis or other vascular lesion to suggest a renovascular component of hypertension. 2. No evidence of  pheochromocytoma. 3. Moderate hiatal hernia. 4.  Aortic Atherosclerosis (ICD10-170.0)   MRI brain 01/23/16: Negative MRI head for age.   In general she states that she is feeling better, she denies any new symptoms since last visit. Even fatigue has improved slightly, she hopes that it will resolve once her HTN is better controlled. It is usually at the end of the day, no loud snoring or apnea.     Review of Systems  Constitutional: Positive for fatigue (nio more than usual). Negative for fever, diaphoresis, activity change, appetite change and unexpected weight change.  HENT: Negative for facial swelling, mouth sores, nosebleeds and trouble swallowing.   Eyes: Negative for redness and visual disturbance.  Respiratory: Negative for cough, shortness of breath and wheezing.   Cardiovascular: Positive for palpitations (occasional). Negative for chest pain and leg swelling.  Gastrointestinal: Negative for nausea, vomiting and abdominal pain.       Negative for changes in bowel habits.  Genitourinary: Negative for dysuria, hematuria, decreased urine volume and difficulty urinating.  Skin: Negative for rash and wound.  Neurological: Negative for seizures, syncope, weakness, numbness and headaches.  Psychiatric/Behavioral: Negative for confusion. The patient is nervous/anxious.       Current Outpatient Prescriptions on File Prior to Visit  Medication Sig Dispense Refill  . Biotin (BIOTIN 5000) 5 MG CAPS Take 1 capsule by mouth daily.    Marland Kitchen gabapentin (NEURONTIN) 300 MG capsule Take 1 capsule by mouth every  evening.     . hydrochlorothiazide (HYDRODIURIL) 25 MG tablet Take 25 mg by mouth daily.    . pravastatin (PRAVACHOL) 40 MG tablet Take 1 tablet by mouth every evening.      No current facility-administered medications on file prior to visit.     Past Medical History  Diagnosis Date  . Hypertension   . Bullous pemphigoid     Allergies  Allergen Reactions  . Atenolol Other  (See Comments)    Drops heart rate  . Hydralazine Hcl Swelling and Other (See Comments)    Swelling on face, arms red from elbow to fingers.  . Sulfa Antibiotics Other (See Comments)    Childhood allergy   . Adhesive [Tape] Rash  . Amlodipine Rash and Other (See Comments)    flushing    Social History   Social History  . Marital Status: Widowed    Spouse Name: N/A  . Number of Children: 1  . Years of Education: N/A   Social History Main Topics  . Smoking status: Never Smoker   . Smokeless tobacco: Never Used  . Alcohol Use: No  . Drug Use: No  . Sexual Activity: Not Asked   Other Topics Concern  . None   Social History Narrative    Filed Vitals:   02/09/16 1056  BP: 165/65  Pulse: 54  Temp: 97.7 F (36.5 C)  Resp: 12   Body mass index is 23.59 kg/(m^2).    Physical Exam  Constitutional: She is oriented to person, place, and time. She appears well-developed and well-nourished. No distress.  HENT:  Head: Atraumatic.  Mouth/Throat: Oropharynx is clear and moist and mucous membranes are normal.  Eyes: Conjunctivae and EOM are normal.  Neck: No JVD present.  Cardiovascular: Regular rhythm.  Bradycardia present.   DP pulses present bilateral. SEM I/VI heard on RUSB  Respiratory: Effort normal and breath sounds normal. No respiratory distress.  GI: Soft. She exhibits no mass. There is no hepatomegaly. There is no tenderness.  Musculoskeletal: She exhibits no edema or tenderness.  Neurological: She is alert and oriented to person, place, and time. She has normal strength. Coordination and gait normal.  Stable gait with no assistance.  Skin: Skin is warm. No lesion noted. There is cyanosis (toes, bilateral.). No erythema.  Psychiatric: Her speech is normal. Her mood appears anxious.  Well groomed, good eye contact.    ASSESSMENT AND PLAN:   [name] was seen today for follow-up.  Diagnoses and all orders for this visit:  Bradycardia, sinus  Stable  overall. Cardiologist evaluation is appropriate and was discussed but she prefers to hold on this for 1-2 months since she feels like palpitations are now occasional now and overall feeling better.  She states that she does not want to have a stress test, I explained that if palpitations continue she may need heart monitor to evaluate for arrhythmias.  We will continue weaning off carvedilol, today was decreased from bid to qd. Continue monitoring heart rate of home.  Essential hypertension  Better controlled. BP checked on both arms and no major difference in readings (165/65 RUE and 170/60 LUE). At this point I don't think further workup is needed. Spironolactone added, some side effects discussed. No changes on Hydrochlorthiazide or Lisinopril. Continue monitoring BP at home, goal BP < 150/90. Follow-up in 2 months. BMP in 2-3 weeks.   -     spironolactone (ALDACTONE) 25 MG tablet; Take 1 tablet (25 mg total) by mouth daily. -  carvedilol (COREG) 6.25 MG tablet; Take 0.5 tablets (3.125 mg total) by mouth daily. -     lisinopril (PRINIVIL,ZESTRIL) 40 MG tablet; Take 1 tablet (40 mg total) by mouth daily. -     Basic Metabolic Panel; Future  Raynaud's disease without gangrene  Reported today. Currently she is asymptomatic, recommend being cautious with cold exposure.  Other fatigue  We discussed possible causes, poorly controlled hypertension is certainly one of those as well as bradycardia. So far workup has been otherwise negative. We discussed the possibility of adding a SSRI in the usual, which might help with underlying mild anxiety.    -She was advised to return sooner than planned today if new concerns arise.     Betty G. Martinique, MD  [facility]. [place] office.

## 2016-02-25 ENCOUNTER — Encounter: Payer: Self-pay | Admitting: Family Medicine

## 2016-03-01 ENCOUNTER — Other Ambulatory Visit (INDEPENDENT_AMBULATORY_CARE_PROVIDER_SITE_OTHER): Payer: Medicare Other

## 2016-03-01 ENCOUNTER — Encounter: Payer: Self-pay | Admitting: Family Medicine

## 2016-03-01 DIAGNOSIS — I10 Essential (primary) hypertension: Secondary | ICD-10-CM | POA: Diagnosis not present

## 2016-03-01 LAB — BASIC METABOLIC PANEL
BUN: 19 mg/dL (ref 6–23)
CHLORIDE: 100 meq/L (ref 96–112)
CO2: 30 mEq/L (ref 19–32)
Calcium: 9.4 mg/dL (ref 8.4–10.5)
Creatinine, Ser: 1.04 mg/dL (ref 0.40–1.20)
GFR: 54.26 mL/min — AB (ref >60.00)
Glucose, Bld: 93 mg/dL (ref 70–99)
POTASSIUM: 3.7 meq/L (ref 3.5–5.1)
SODIUM: 136 meq/L (ref 135–145)

## 2016-03-29 ENCOUNTER — Telehealth: Payer: Self-pay | Admitting: Family Medicine

## 2016-03-29 MED ORDER — HYDROCHLOROTHIAZIDE 25 MG PO TABS: 25.0000 mg | ORAL_TABLET | Freq: Every day | ORAL | 1 refills | Status: DC

## 2016-03-29 NOTE — Telephone Encounter (Signed)
Patient needs refill on her  hydrochlorothiazide (HYDRODIURIL) 25 MG tablet   Sent to: [place], [place] [phone] (Phone) [phone] (Fax)    She has about 2 weeks left on her medication

## 2016-03-29 NOTE — Telephone Encounter (Signed)
Rx sent in

## 2016-04-12 DIAGNOSIS — I10 Essential (primary) hypertension: Secondary | ICD-10-CM | POA: Diagnosis not present

## 2016-04-12 DIAGNOSIS — R197 Diarrhea, unspecified: Secondary | ICD-10-CM | POA: Diagnosis not present

## 2016-04-12 DIAGNOSIS — K219 Gastro-esophageal reflux disease without esophagitis: Secondary | ICD-10-CM | POA: Diagnosis not present

## 2016-04-20 DIAGNOSIS — K208 Other esophagitis: Secondary | ICD-10-CM | POA: Diagnosis not present

## 2016-04-20 DIAGNOSIS — K3189 Other diseases of stomach and duodenum: Secondary | ICD-10-CM | POA: Diagnosis not present

## 2016-04-20 DIAGNOSIS — K209 Esophagitis, unspecified: Secondary | ICD-10-CM | POA: Diagnosis not present

## 2016-04-20 DIAGNOSIS — K2980 Duodenitis without bleeding: Secondary | ICD-10-CM | POA: Diagnosis not present

## 2016-04-20 DIAGNOSIS — K449 Diaphragmatic hernia without obstruction or gangrene: Secondary | ICD-10-CM | POA: Diagnosis not present

## 2016-04-20 DIAGNOSIS — K319 Disease of stomach and duodenum, unspecified: Secondary | ICD-10-CM | POA: Diagnosis not present

## 2016-04-20 DIAGNOSIS — K2970 Gastritis, unspecified, without bleeding: Secondary | ICD-10-CM | POA: Diagnosis not present

## 2016-04-22 ENCOUNTER — Encounter: Payer: Self-pay | Admitting: Family Medicine

## 2016-04-22 ENCOUNTER — Ambulatory Visit (INDEPENDENT_AMBULATORY_CARE_PROVIDER_SITE_OTHER): Payer: Medicare Other | Admitting: Family Medicine

## 2016-04-22 VITALS — BP 140/80 | HR 56 | Temp 98.1°F | Resp 12 | Ht 62.0 in | Wt 127.4 lb

## 2016-04-22 DIAGNOSIS — R233 Spontaneous ecchymoses: Secondary | ICD-10-CM

## 2016-04-22 DIAGNOSIS — R001 Bradycardia, unspecified: Secondary | ICD-10-CM

## 2016-04-22 DIAGNOSIS — I10 Essential (primary) hypertension: Secondary | ICD-10-CM

## 2016-04-22 MED ORDER — CARVEDILOL 6.25 MG PO TABS: 3.1250 mg | ORAL_TABLET | Freq: Every day | ORAL | 1 refills | Status: DC

## 2016-04-22 NOTE — Patient Instructions (Addendum)
A few things to remember from today's visit:   Essential hypertension - Plan: Basic Metabolic Panel, carvedilol (COREG) 6.25 MG tablet  Bradycardia, sinus  Continue weaning off carvedilol, half tablet every other day for 2 weeks, then every third day for a week, then every fourth day for a week and so forth. Goal blood pressure less 150/90.  No changes in the rest of the medications.  Medicare covers a annual preventive visit, which is strongly recommended , it is once per year and involves a series of questions to identify risk factors; so we can try to prevent possible complications. This does not need to be done by a doctor.  We have a nurse Investment banker, [employer]) here that is highly qualified to do it, it can be arrange same date you have a follow up appointment with me or labs scheduled, and it 100% covered by Medicare. So before you leave today I would like for you to arrange visit with [name] for Medicare wellness visit.  Please be sure medication list is accurate. If a new problem present, please set up appointment sooner than planned today.

## 2016-04-22 NOTE — Progress Notes (Signed)
Amber Baird is a 81 y.o.female, who is here today to follow on HTN.  Currently she is on HCTZ 25 mg, Lisinopril 40 mg, Coreg 6.25 mg 1/2 tab daily,and Spironolactone 25 mg. She is taking medications as instructed, no side effects reported.  She has not noted unusual headache, visual changes, exertional chest pain, dyspnea,  focal weakness, or edema.  She brought some BP readings, most are < 140/90 and a few < 150/90. HR still bradycardic but mid 50's.    Lab Results  Component Value Date   CREATININE 1.04 03/01/2016   BUN 19 03/01/2016   NA 136 03/01/2016   K 3.7 03/01/2016   CL 100 03/01/2016   CO2 30 03/01/2016    She has not tolerated other antihypertensive medications, she has undergone extensive work-up to evaluate for other causes of severe HTN, her SBP used to be in the high 190's and low 200's with a few ER visits due to headache and flush sensation as well as severe fatigue. She is reporting feeling greatly better,including fatigue.  She has no concerns today.   Review of Systems  Constitutional: Negative for activity change, appetite change, fatigue, fever and unexpected weight change.  HENT: Negative for mouth sores, nosebleeds and trouble swallowing.   Eyes: Negative for pain, redness and visual disturbance.  Respiratory: Negative for cough, shortness of breath and wheezing.   Cardiovascular: Negative for chest pain, palpitations and leg swelling.  Gastrointestinal: Negative for abdominal pain, nausea and vomiting.       Negative for changes in bowel habits.  Genitourinary: Negative for decreased urine volume, difficulty urinating, dysuria and hematuria.  Musculoskeletal: Negative for gait problem and myalgias.  Skin: Negative for color change, pallor and rash.  Neurological: Negative for dizziness, seizures, syncope, weakness, numbness and headaches.  Psychiatric/Behavioral: Negative for confusion and sleep disturbance. The patient is not  nervous/anxious.      Current Outpatient Prescriptions on File Prior to Visit  Medication Sig Dispense Refill  . Biotin (BIOTIN 5000) 5 MG CAPS Take 1 capsule by mouth daily.    Marland Kitchen gabapentin (NEURONTIN) 300 MG capsule Take 1 capsule by mouth every evening.     . hydrochlorothiazide (HYDRODIURIL) 25 MG tablet Take 1 tablet (25 mg total) by mouth daily. 90 tablet 1  . lisinopril (PRINIVIL,ZESTRIL) 40 MG tablet Take 1 tablet (40 mg total) by mouth daily. 90 tablet 1  . pravastatin (PRAVACHOL) 40 MG tablet Take 1 tablet by mouth every evening.     Marland Kitchen spironolactone (ALDACTONE) 25 MG tablet TAKE 1 TABLET(25 MG) BY MOUTH DAILY 90 tablet 0   No current facility-administered medications on file prior to visit.      Past Medical History:  Diagnosis Date  . Bullous pemphigoid   . Hypertension     Allergies  Allergen Reactions  . Atenolol Other (See Comments)    Drops heart rate  . Hydralazine Hcl Swelling and Other (See Comments)    Swelling on face, arms red from elbow to fingers.  . Sulfa Antibiotics Other (See Comments)    Childhood allergy   . Adhesive [Tape] Rash  . Amlodipine Rash and Other (See Comments)    flushing    Social History   Social History  . Marital status: Widowed    Spouse name: N/A  . Number of children: 1  . Years of education: N/A   Social History Main Topics  . Smoking status: Never Smoker  . Smokeless tobacco: Never Used  .  Alcohol use No  . Drug use: No  . Sexual activity: Not Asked   Other Topics Concern  . None   Social History Narrative  . None    Vitals:   04/22/16 1604  BP: 140/80  Pulse: (!) 56  Resp: 12  Temp: 98.1 F (36.7 C)   Body mass index is 23.3 kg/m.    Physical Exam  Nursing note and vitals reviewed. Constitutional: She is oriented to person, place, and time. She appears well-developed and well-nourished. No distress.  HENT:  Head: Atraumatic.  Mouth/Throat: Oropharynx is clear and moist and mucous membranes  are normal.  Eyes: Conjunctivae and EOM are normal.  Cardiovascular: Regular rhythm.  Bradycardia present.   Pulses:      Dorsalis pedis pulses are 2+ on the right side, and 2+ on the left side.  No murmur heard today  Respiratory: Effort normal and breath sounds normal. No respiratory distress.  GI: Soft. She exhibits no mass. There is no hepatomegaly. There is no tenderness.  Musculoskeletal: She exhibits no edema or tenderness.  Neurological: She is alert and oriented to person, place, and time. She has normal strength. Coordination and gait normal.  Stable gait with no assistance.  Skin: Skin is warm. Ecchymosis (a few on forearms, no tender) noted. No rash noted. No erythema.  Psychiatric: She has a normal mood and affect. Her speech is normal. Cognition and memory are normal.  Well groomed, good eye contact.    ASSESSMENT AND PLAN:   Musette was seen today for follow-up.  Diagnoses and all orders for this visit:  Essential hypertension  Adequately controlled for her age, goal < 150/90. No changes in Lisinopril,HCTZ,or Spironolactone. She will continue weaning off Coreg.  DASH diet recommended. Periodic eye examination. F/U in 6 months, before if needed.  -     Basic Metabolic Panel -     carvedilol (COREG) 6.25 MG tablet; Take 0.5 tablets (3.125 mg total) by mouth daily. Continue q 2 days for 2 weeks,then q 3 d x 1 week,and so fourth  Bradycardia, sinus  Improved. She will continue weaning off Coreg. Instructed about warning signs.  Senile ecchymosis  She has no concerns in this regard. No Hx of recent trauma and no on Aspirin, explained that it is most likely benign and age related. Will continue monitoring.    -Amber Baird was advised to return sooner than planned today if new concerns arise.     Eusevio Schriver G. Martinique, MD  [facility]. [place] office.

## 2016-04-23 ENCOUNTER — Encounter: Payer: Self-pay | Admitting: Family Medicine

## 2016-04-23 LAB — BASIC METABOLIC PANEL
BUN: 15 mg/dL (ref 6–23)
CALCIUM: 9.4 mg/dL (ref 8.4–10.5)
CHLORIDE: 103 meq/L (ref 96–112)
CO2: 30 meq/L (ref 19–32)
CREATININE: 1.08 mg/dL (ref 0.40–1.20)
GFR: 51.93 mL/min — ABNORMAL LOW (ref >60.00)
Glucose, Bld: 81 mg/dL (ref 70–99)
Potassium: 4.2 mEq/L (ref 3.5–5.1)
Sodium: 141 mEq/L (ref 135–145)

## 2016-05-12 ENCOUNTER — Other Ambulatory Visit: Payer: Self-pay | Admitting: Family Medicine
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
## 2016-05-20 ENCOUNTER — Ambulatory Visit (INDEPENDENT_AMBULATORY_CARE_PROVIDER_SITE_OTHER): Payer: Medicare Other

## 2016-05-20 DIAGNOSIS — Z23 Encounter for immunization: Secondary | ICD-10-CM | POA: Diagnosis not present

## 2016-05-20 DIAGNOSIS — Z0000 Encounter for general adult medical examination without abnormal findings: Secondary | ICD-10-CM

## 2016-05-20 NOTE — Patient Instructions (Addendum)
Amber Baird , Thank you for taking time to come for your Medicare Wellness Visit. I appreciate your ongoing commitment to your health goals. Please review the following plan we discussed and let me know if I can assist you in the future.   Will research tetanus vaccine; (TDAP)   Will postpone dexa scan; as it was completed x 81 yo and  No issues Will monitor calcium 1274m recommended   Will take high dose flu shot today    These are the goals we discussed: Goals    . patient          Considering moving to residential community;   Continue to explore senior communities        This is a list of the screening recommended for you and due dates:  Health Maintenance  Topic Date Due  . Tetanus Vaccine  09/07/1953  . Shingles Vaccine  09/06/1996  . DEXA scan (bone density measurement)  09/06/2001  . Pneumonia vaccines (1 of 2 - PCV13) 09/06/2001  . Flu Shot  05/28/2016*  *Topic was postponed. The date shown is not the original due date.         Fall Prevention in the Home  Falls can cause injuries. They can happen to people of all ages. There are many things you can do to make your home safe and to help prevent falls.  WHAT CAN I DO ON THE OUTSIDE OF MY HOME?  Regularly fix the edges of walkways and driveways and fix any cracks.  Remove anything that might make you trip as you walk through a door, such as a raised step or threshold.  Trim any bushes or trees on the path to your home.  Use bright outdoor lighting.  Clear any walking paths of anything that might make someone trip, such as rocks or tools.  Regularly check to see if handrails are loose or broken. Make sure that both sides of any steps have handrails.  Any raised decks and porches should have guardrails on the edges.  Have any leaves, snow, or ice cleared regularly.  Use sand or salt on walking paths during winter.  Clean up any spills in your garage right away. This includes oil or grease spills. WHAT  CAN I DO IN THE BATHROOM?   Use night lights.  Install grab bars by the toilet and in the tub and shower. Do not use towel bars as grab bars.  Use non-skid mats or decals in the tub or shower.  If you need to sit down in the shower, use a plastic, non-slip stool.  Keep the floor dry. Clean up any water that spills on the floor as soon as it happens.  Remove soap buildup in the tub or shower regularly.  Attach bath mats securely with double-sided non-slip rug tape.  Do not have throw rugs and other things on the floor that can make you trip. WHAT CAN I DO IN THE BEDROOM?  Use night lights.  Make sure that you have a light by your bed that is easy to reach.  Do not use any sheets or blankets that are too big for your bed. They should not hang down onto the floor.  Have a firm chair that has side arms. You can use this for support while you get dressed.  Do not have throw rugs and other things on the floor that can make you trip. WHAT CAN I DO IN THE KITCHEN?  Clean up any spills right away.  Avoid walking on wet floors.  Keep items that you use a lot in easy-to-reach places.  If you need to reach something above you, use a strong step stool that has a grab bar.  Keep electrical cords out of the way.  Do not use floor polish or wax that makes floors slippery. If you must use wax, use non-skid floor wax.  Do not have throw rugs and other things on the floor that can make you trip. WHAT CAN I DO WITH MY STAIRS?  Do not leave any items on the stairs.  Make sure that there are handrails on both sides of the stairs and use them. Fix handrails that are broken or loose. Make sure that handrails are as long as the stairways.  Check any carpeting to make sure that it is firmly attached to the stairs. Fix any carpet that is loose or worn.  Avoid having throw rugs at the top or bottom of the stairs. If you do have throw rugs, attach them to the floor with carpet tape.  Make  sure that you have a light switch at the top of the stairs and the bottom of the stairs. If you do not have them, ask someone to add them for you. WHAT ELSE CAN I DO TO HELP PREVENT FALLS?  Wear shoes that:  Do not have high heels.  Have rubber bottoms.  Are comfortable and fit you well.  Are closed at the toe. Do not wear sandals.  If you use a stepladder:  Make sure that it is fully opened. Do not climb a closed stepladder.  Make sure that both sides of the stepladder are locked into place.  Ask someone to hold it for you, if possible.  Clearly mark and make sure that you can see:  Any grab bars or handrails.  First and last steps.  Where the edge of each step is.  Use tools that help you move around (mobility aids) if they are needed. These include:  Canes.  Walkers.  Scooters.  Crutches.  Turn on the lights when you go into a dark area. Replace any light bulbs as soon as they burn out.  Set up your furniture so you have a clear path. Avoid moving your furniture around.  If any of your floors are uneven, fix them.  If there are any pets around you, be aware of where they are.  Review your medicines with your doctor. Some medicines can make you feel dizzy. This can increase your chance of falling. Ask your doctor what other things that you can do to help prevent falls.   This information is not intended to replace advice given to you by your health care provider. Make sure you discuss any questions you have with your health care provider.   Document Released: 05/15/2009 Document Revised: 12/03/2014 Document Reviewed: 08/23/2014 Elsevier Interactive Patient Education 2016 [place] Maintenance, Female Adopting a healthy lifestyle and getting preventive care can go a long way to promote health and wellness. Talk with your health care provider about what schedule of regular examinations is right for you. This is a good chance for you to check in  with your provider about disease prevention and staying healthy. In between checkups, there are plenty of things you can do on your own. Experts have done a lot of research about which lifestyle changes and preventive measures are most likely to keep you healthy. Ask your health care provider for more information. WEIGHT AND  DIET  Eat a healthy diet  Be sure to include plenty of vegetables, fruits, low-fat dairy products, and lean protein.  Do not eat a lot of foods high in solid fats, added sugars, or salt.  Get regular exercise. This is one of the most important things you can do for your health.  Most adults should exercise for at least 150 minutes each week. The exercise should increase your heart rate and make you sweat (moderate-intensity exercise).  Most adults should also do strengthening exercises at least twice a week. This is in addition to the moderate-intensity exercise.  Maintain a healthy weight  Body mass index (BMI) is a measurement that can be used to identify possible weight problems. It estimates body fat based on height and weight. Your health care provider can help determine your BMI and help you achieve or maintain a healthy weight.  For females 72 years of age and older:   A BMI below 18.5 is considered underweight.  A BMI of 18.5 to 24.9 is normal.  A BMI of 25 to 29.9 is considered overweight.  A BMI of 30 and above is considered obese.  Watch levels of cholesterol and blood lipids  You should start having your blood tested for lipids and cholesterol at 79 years of age, then have this test every 5 years.  You may need to have your cholesterol levels checked more often if:  Your lipid or cholesterol levels are high.  You are older than 79 years of age.  You are at high risk for heart disease.  CANCER SCREENING   Lung Cancer  Lung cancer screening is recommended for adults 25-22 years old who are at high risk for lung cancer because of a history  of smoking.  A yearly low-dose CT scan of the lungs is recommended for people who:  Currently smoke.  Have quit within the past 15 years.  Have at least a 30-pack-year history of smoking. A pack year is smoking an average of one pack of cigarettes a day for 1 year.  Yearly screening should continue until it has been 15 years since you quit.  Yearly screening should stop if you develop a health problem that would prevent you from having lung cancer treatment.  Breast Cancer  Practice breast self-awareness. This means understanding how your breasts normally appear and feel.  It also means doing regular breast self-exams. Let your health care provider know about any changes, no matter how small.  If you are in your 20s or 30s, you should have a clinical breast exam (CBE) by a health care provider every 1-3 years as part of a regular health exam.  If you are 56 or older, have a CBE every year. Also consider having a breast X-ray (mammogram) every year.  If you have a family history of breast cancer, talk to your health care provider about genetic screening.  If you are at high risk for breast cancer, talk to your health care provider about having an MRI and a mammogram every year.  Breast cancer gene (BRCA) assessment is recommended for women who have family members with BRCA-related cancers. BRCA-related cancers include:  Breast.  Ovarian.  Tubal.  Peritoneal cancers.  Results of the assessment will determine the need for genetic counseling and BRCA1 and BRCA2 testing. Cervical Cancer Your health care provider may recommend that you be screened regularly for cancer of the pelvic organs (ovaries, uterus, and vagina). This screening involves a pelvic examination, including checking for microscopic  changes to the surface of your cervix (Pap test). You may be encouraged to have this screening done every 3 years, beginning at age 10.  For women ages 19-65, health care providers may  recommend pelvic exams and Pap testing every 3 years, or they may recommend the Pap and pelvic exam, combined with testing for human papilloma virus (HPV), every 5 years. Some types of HPV increase your risk of cervical cancer. Testing for HPV may also be done on women of any age with unclear Pap test results.  Other health care providers may not recommend any screening for nonpregnant women who are considered low risk for pelvic cancer and who do not have symptoms. Ask your health care provider if a screening pelvic exam is right for you.  If you have had past treatment for cervical cancer or a condition that could lead to cancer, you need Pap tests and screening for cancer for at least 20 years after your treatment. If Pap tests have been discontinued, your risk factors (such as having a new sexual partner) need to be reassessed to determine if screening should resume. Some women have medical problems that increase the chance of getting cervical cancer. In these cases, your health care provider may recommend more frequent screening and Pap tests. Colorectal Cancer  This type of cancer can be detected and often prevented.  Routine colorectal cancer screening usually begins at 79 years of age and continues through 79 years of age.  Your health care provider may recommend screening at an earlier age if you have risk factors for colon cancer.  Your health care provider may also recommend using home test kits to check for hidden blood in the stool.  A small camera at the end of a tube can be used to examine your colon directly (sigmoidoscopy or colonoscopy). This is done to check for the earliest forms of colorectal cancer.  Routine screening usually begins at age 66.  Direct examination of the colon should be repeated every 5-10 years through 79 years of age. However, you may need to be screened more often if early forms of precancerous polyps or small growths are found. Skin Cancer  Check your  skin from head to toe regularly.  Tell your health care provider about any new moles or changes in moles, especially if there is a change in a mole's shape or color.  Also tell your health care provider if you have a mole that is larger than the size of a pencil eraser.  Always use sunscreen. Apply sunscreen liberally and repeatedly throughout the day.  Protect yourself by wearing long sleeves, pants, a wide-brimmed hat, and sunglasses whenever you are outside. HEART DISEASE, DIABETES, AND HIGH BLOOD PRESSURE   High blood pressure causes heart disease and increases the risk of stroke. High blood pressure is more likely to develop in:  People who have blood pressure in the high end of the normal range (130-139/85-89 mm Hg).  People who are overweight or obese.  People who are African American.  If you are 20-66 years of age, have your blood pressure checked every 3-5 years. If you are 58 years of age or older, have your blood pressure checked every year. You should have your blood pressure measured twice--once when you are at a hospital or clinic, and once when you are not at a hospital or clinic. Record the average of the two measurements. To check your blood pressure when you are not at a hospital or clinic, you  can use:  An automated blood pressure machine at a pharmacy.  A home blood pressure monitor.  If you are between 75 years and 67 years old, ask your health care provider if you should take aspirin to prevent strokes.  Have regular diabetes screenings. This involves taking a blood sample to check your fasting blood sugar level.  If you are at a normal weight and have a low risk for diabetes, have this test once every three years after 79 years of age.  If you are overweight and have a high risk for diabetes, consider being tested at a younger age or more often. PREVENTING INFECTION  Hepatitis B  If you have a higher risk for hepatitis B, you should be screened for this  virus. You are considered at high risk for hepatitis B if:  You were born in a country where hepatitis B is common. Ask your health care provider which countries are considered high risk.  Your parents were born in a high-risk country, and you have not been immunized against hepatitis B (hepatitis B vaccine).  You have HIV or AIDS.  You use needles to inject street drugs.  You live with someone who has hepatitis B.  You have had sex with someone who has hepatitis B.  You get hemodialysis treatment.  You take certain medicines for conditions, including cancer, organ transplantation, and autoimmune conditions. Hepatitis C  Blood testing is recommended for:  Everyone born from [birth year] through 1963.  Anyone with known risk factors for hepatitis C. Sexually transmitted infections (STIs)  You should be screened for sexually transmitted infections (STIs) including gonorrhea and chlamydia if:  You are sexually active and are younger than 79 years of age.  You are older than 79 years of age and your health care provider tells you that you are at risk for this type of infection.  Your sexual activity has changed since you were last screened and you are at an increased risk for chlamydia or gonorrhea. Ask your health care provider if you are at risk.  If you do not have HIV, but are at risk, it may be recommended that you take a prescription medicine daily to prevent HIV infection. This is called pre-exposure prophylaxis (PrEP). You are considered at risk if:  You are sexually active and do not regularly use condoms or know the HIV status of your partner(s).  You take drugs by injection.  You are sexually active with a partner who has HIV. Talk with your health care provider about whether you are at high risk of being infected with HIV. If you choose to begin PrEP, you should first be tested for HIV. You should then be tested every 3 months for as long as you are taking PrEP.  PREGNANCY     If you are premenopausal and you may become pregnant, ask your health care provider about preconception counseling.  If you may become pregnant, take 400 to 800 micrograms (mcg) of folic acid every day.  If you want to prevent pregnancy, talk to your health care provider about birth control (contraception). OSTEOPOROSIS AND MENOPAUSE   Osteoporosis is a disease in which the bones lose minerals and strength with aging. This can result in serious bone fractures. Your risk for osteoporosis can be identified using a bone density scan.  If you are 65 years of age or older, or if you are at risk for osteoporosis and fractures, ask your health care provider if you should be screened.  Ask  your health care provider whether you should take a calcium or vitamin D supplement to lower your risk for osteoporosis.  Menopause may have certain physical symptoms and risks.  Hormone replacement therapy may reduce some of these symptoms and risks. Talk to your health care provider about whether hormone replacement therapy is right for you.  HOME CARE INSTRUCTIONS   Schedule regular health, dental, and eye exams.  Stay current with your immunizations.   Do not use any tobacco products including cigarettes, chewing tobacco, or electronic cigarettes.  If you are pregnant, do not drink alcohol.  If you are breastfeeding, limit how much and how often you drink alcohol.  Limit alcohol intake to no more than 1 drink per day for nonpregnant women. One drink equals 12 ounces of beer, 5 ounces of wine, or 1 ounces of hard liquor.  Do not use street drugs.  Do not share needles.  Ask your health care provider for help if you need support or information about quitting drugs.  Tell your health care provider if you often feel depressed.  Tell your health care provider if you have ever been abused or do not feel safe at home.   This information is not intended to replace advice given to you by your  health care provider. Make sure you discuss any questions you have with your health care provider.   Document Released: 02/01/2011 Document Revised: 08/09/2014 Document Reviewed: 06/20/2013 Elsevier Interactive Patient Education [employer].

## 2016-05-24 NOTE — Progress Notes (Signed)
I have reviewed documentation from this visit and I agree with recommendations given.  Aliviah Spain G. Amadi Frady, MD  [facility]. [place] office.   

## 2016-08-16 ENCOUNTER — Encounter: Payer: Self-pay | Admitting: Family Medicine

## 2016-08-16 ENCOUNTER — Ambulatory Visit (INDEPENDENT_AMBULATORY_CARE_PROVIDER_SITE_OTHER): Payer: Medicare Other | Admitting: Family Medicine

## 2016-08-16 VITALS — BP 130/70 | HR 72 | Resp 12 | Ht 62.0 in | Wt 130.4 lb

## 2016-08-16 DIAGNOSIS — N1832 Chronic kidney disease, stage 3b: Secondary | ICD-10-CM | POA: Insufficient documentation

## 2016-08-16 DIAGNOSIS — N183 Chronic kidney disease, stage 3 unspecified: Secondary | ICD-10-CM

## 2016-08-16 DIAGNOSIS — E7800 Pure hypercholesterolemia, unspecified: Secondary | ICD-10-CM | POA: Diagnosis not present

## 2016-08-16 DIAGNOSIS — G4700 Insomnia, unspecified: Secondary | ICD-10-CM | POA: Diagnosis not present

## 2016-08-16 DIAGNOSIS — I10 Essential (primary) hypertension: Secondary | ICD-10-CM

## 2016-08-16 MED ORDER — HYDROCHLOROTHIAZIDE 25 MG PO TABS: 25.0000 mg | ORAL_TABLET | Freq: Every day | ORAL | 2 refills | Status: DC

## 2016-08-16 NOTE — Patient Instructions (Addendum)
A few things to remember from today's visit:   Essential hypertension - Plan: Lipid panel, Comprehensive metabolic panel, Microalbumin / creatinine urine ratio  Hypomagnesemia  Hypercholesteremia  Insomnia, unspecified type  CKD (chronic kidney disease), stage III  No changes today. Labs in a week, fasting.    Please be sure medication list is accurate. If a new problem present, please set up appointment sooner than planned today.

## 2016-08-16 NOTE — Progress Notes (Signed)
[name] is a 82 y.o.female, who is here today to follow on HTN.  Currently Spironolactone 25 mg ,Lisinopril 40 mg, and HCTZ 25 mg daily.  She is taking medications as instructed, no side effects reported.  She has not noted unusual headache, visual changes, exertional chest pain, dyspnea,  focal weakness, or edema.  Episodes of dizziness and flushing sensation have resolved.   Lab Results  Component Value Date   CREATININE 1.08 04/22/2016   BUN 15 04/22/2016   NA 141 04/22/2016   K 4.2 04/22/2016   CL 103 04/22/2016   CO2 30 04/22/2016   BP readings, occasionally SBP in the 150's.   Hyperlipidemia:  Currently on Pravastatin 20 mg daily. Following a low fat diet: Yes.  She has not noted side effects with medication.  She is doing Yoga 3 times per week.  Hx of hypoMg, she is currently on Mg Oxide 250 mg, she stays.  01/23/16 e GFR 53,04/2016 54.6. 04/2015 e GFR 54. She has not noted gross hematuria or foam in urine.  Insomnia:  Currently she is on Gabapentin 300 mg at bedtime, which still helping with sleep. She sleeps about 6-7 hours. Tolerating medication well, denies side effects.   Review of Systems  Constitutional: Negative for activity change, appetite change, fatigue, fever and unexpected weight change.  HENT: Negative for mouth sores, nosebleeds and trouble swallowing.   Eyes: Negative for pain, redness and visual disturbance.  Respiratory: Negative for cough, shortness of breath and wheezing.   Cardiovascular: Negative for chest pain, palpitations and leg swelling.  Gastrointestinal: Negative for abdominal pain, nausea and vomiting.       Negative for changes in bowel habits.  Genitourinary: Negative for decreased urine volume, difficulty urinating and hematuria.  Neurological: Negative for syncope, weakness and headaches.  Psychiatric/Behavioral: Negative for confusion. The patient is nervous/anxious.      Current Outpatient  Prescriptions on File Prior to Visit  Medication Sig Dispense Refill  . Biotin (BIOTIN 5000) 5 MG CAPS Take 1 capsule by mouth daily.    Marland Kitchen gabapentin (NEURONTIN) 300 MG capsule Take 1 capsule by mouth every evening.     Marland Kitchen lisinopril (PRINIVIL,ZESTRIL) 40 MG tablet Take 1 tablet (40 mg total) by mouth daily. 90 tablet 1  . pravastatin (PRAVACHOL) 40 MG tablet Take 1 tablet by mouth every evening.     Marland Kitchen spironolactone (ALDACTONE) 25 MG tablet TAKE 1 TABLET(25 MG) BY MOUTH DAILY 90 tablet 1   No current facility-administered medications on file prior to visit.      Past Medical History:  Diagnosis Date  . Bullous pemphigoid   . Hypertension     Allergies  Allergen Reactions  . Atenolol Other (See Comments)    Drops heart rate  . Hydralazine Hcl Swelling and Other (See Comments)    Swelling on face, arms red from elbow to fingers.  . Sulfa Antibiotics Other (See Comments)    Childhood allergy   . Adhesive [Tape] Rash  . Amlodipine Rash and Other (See Comments)    flushing    Social History   Social History  . Marital status: Widowed    Spouse name: N/A  . Number of children: 1  . Years of education: N/A   Social History Main Topics  . Smoking status: Never Smoker  . Smokeless tobacco: Never Used  . Alcohol use No  . Drug use: No  . Sexual activity: Not Asked   Other Topics Concern  .  None   Social History Narrative  . None    Vitals:   08/16/16 0917  BP: 130/70  Pulse: 72  Resp: 12   O2 sat at RA 98%  Body mass index is 23.85 kg/m.    Physical Exam  Nursing note and vitals reviewed. Constitutional: She is oriented to person, place, and time. She appears well-developed and well-nourished. No distress.  HENT:  Head: Atraumatic.  Mouth/Throat: Oropharynx is clear and moist and mucous membranes are normal.  Eyes: Conjunctivae and EOM are normal. Pupils are equal, round, and reactive to light.  Neck: No tracheal deviation present. No thyroid mass and no  thyromegaly present.  Cardiovascular: Normal rate and regular rhythm.   Murmur (soft SEM RUSB) heard. Pulses:      Dorsalis pedis pulses are 2+ on the right side, and 2+ on the left side.  Respiratory: Effort normal and breath sounds normal. No respiratory distress.  GI: Soft. She exhibits no mass. There is no hepatomegaly. There is no tenderness.  Musculoskeletal: She exhibits edema (trace pitting LE edema,bilateral). She exhibits no tenderness.  Neurological: She is alert and oriented to person, place, and time. She has normal strength. Coordination normal.  Stable gait with no assistance.   Skin: Skin is warm. No erythema.  Psychiatric: She has a normal mood and affect.  Well groomed, good eye contact.    ASSESSMENT AND PLAN:   Amber Baird was seen today for follow-up.  Diagnoses and all orders for this visit:  Insomnia, unspecified type  Well controlled. No changes in current management. Good sleep hygiene. F/U in 6-12 months.  Essential hypertension  Adequately controlled. No changes in current management. DASH-low salt diet recommended. Eye exam recommended annually. F/U in 6 months, before if needed.  -     Comprehensive metabolic panel; Future -     Microalbumin / creatinine urine ratio; Future -     hydrochlorothiazide (HYDRODIURIL) 25 MG tablet; Take 1 tablet (25 mg total) by mouth daily.  Hypomagnesemia  No changes in current management, will follow labs done today and will give further recommendations accordingly.  Hypercholesteremia  No changes in current management, will follow labs done today and will give further recommendations accordingly. Low fat diet to continue. F/U in 6-12 months.  -     Lipid panel; Future -     Comprehensive metabolic panel; Future  CKD (chronic kidney disease), stage III  She has e GFR 50's sine 04/2015, stable. Low salt diet, avoid NSAID's. Continue Lisinopril. F/U in 6 months.  -     Microalbumin / creatinine urine  ratio; Future -     VITAMIN D 25 Hydroxy (Vit-D Deficiency, Fractures); Future   She is not fasting today, so will come back for labs in a week.    -[name] was advised to return sooner than planned today if new concerns arise.     Nuria Phebus G. Martinique, MD  [facility]. [place] office.

## 2016-08-16 NOTE — Progress Notes (Signed)
Pre visit review using our clinic review tool, if applicable. No additional management support is needed unless otherwise documented below in the visit note. 

## 2016-08-17 ENCOUNTER — Encounter: Payer: Self-pay | Admitting: Family Medicine

## 2016-08-23 ENCOUNTER — Other Ambulatory Visit (INDEPENDENT_AMBULATORY_CARE_PROVIDER_SITE_OTHER): Payer: Medicare Other

## 2016-08-23 DIAGNOSIS — N183 Chronic kidney disease, stage 3 unspecified: Secondary | ICD-10-CM

## 2016-08-23 DIAGNOSIS — I10 Essential (primary) hypertension: Secondary | ICD-10-CM

## 2016-08-23 DIAGNOSIS — E7800 Pure hypercholesterolemia, unspecified: Secondary | ICD-10-CM

## 2016-08-23 LAB — COMPREHENSIVE METABOLIC PANEL
ALBUMIN: 4.3 g/dL (ref 3.5–5.2)
ALT: 11 U/L (ref 0–35)
AST: 14 U/L (ref 0–37)
Alkaline Phosphatase: 60 U/L (ref 39–117)
BILIRUBIN TOTAL: 0.7 mg/dL (ref 0.2–1.2)
BUN: 32 mg/dL — ABNORMAL HIGH (ref 6–23)
CALCIUM: 9.9 mg/dL (ref 8.4–10.5)
CHLORIDE: 94 meq/L — AB (ref 96–112)
CO2: 28 meq/L (ref 19–32)
CREATININE: 1.26 mg/dL — AB (ref 0.40–1.20)
GFR: 43.43 mL/min — AB (ref >60.00)
Glucose, Bld: 85 mg/dL (ref 70–99)
Potassium: 4.6 mEq/L (ref 3.5–5.1)
Sodium: 130 mEq/L — ABNORMAL LOW (ref 135–145)
Total Protein: 6.8 g/dL (ref 6.0–8.3)

## 2016-08-23 LAB — LIPID PANEL
CHOL/HDL RATIO: 4
CHOLESTEROL: 223 mg/dL — AB (ref 0–200)
HDL: 61 mg/dL (ref >39.00)
LDL CALC: 129 mg/dL — AB (ref 0–99)
NonHDL: 161.95
TRIGLYCERIDES: 163 mg/dL — AB (ref 0.0–149.0)
VLDL: 32.6 mg/dL (ref 0.0–40.0)

## 2016-08-23 LAB — MICROALBUMIN / CREATININE URINE RATIO
CREATININE, U: 94.9 mg/dL
MICROALB UR: 1.2 mg/dL (ref 0.0–1.9)
MICROALB/CREAT RATIO: 1.3 mg/g (ref 0.0–30.0)

## 2016-08-23 LAB — VITAMIN D 25 HYDROXY (VIT D DEFICIENCY, FRACTURES): VITD: 41.17 ng/mL (ref 30.00–100.00)

## 2016-08-26 ENCOUNTER — Encounter: Payer: Self-pay | Admitting: Family Medicine

## 2016-08-31 ENCOUNTER — Other Ambulatory Visit: Payer: Self-pay | Admitting: Family Medicine

## 2016-08-31 DIAGNOSIS — N183 Chronic kidney disease, stage 3 unspecified: Secondary | ICD-10-CM

## 2016-09-23 ENCOUNTER — Other Ambulatory Visit: Payer: Self-pay | Admitting: Family Medicine

## 2016-09-23 DIAGNOSIS — I10 Essential (primary) hypertension: Secondary | ICD-10-CM

## 2016-10-28 ENCOUNTER — Encounter: Payer: Self-pay | Admitting: Family Medicine

## 2016-11-01 ENCOUNTER — Other Ambulatory Visit: Payer: Self-pay | Admitting: Family Medicine

## 2016-11-01 DIAGNOSIS — E782 Mixed hyperlipidemia: Secondary | ICD-10-CM

## 2016-11-01 MED ORDER — ATORVASTATIN CALCIUM 40 MG PO TABS: 40.0000 mg | ORAL_TABLET | Freq: Every day | ORAL | 2 refills | Status: DC

## 2016-11-04 ENCOUNTER — Ambulatory Visit: Payer: Self-pay | Admitting: Family Medicine

## 2016-11-04 ENCOUNTER — Other Ambulatory Visit (INDEPENDENT_AMBULATORY_CARE_PROVIDER_SITE_OTHER): Payer: Medicare Other

## 2016-11-04 DIAGNOSIS — N183 Chronic kidney disease, stage 3 unspecified: Secondary | ICD-10-CM

## 2016-11-04 LAB — BASIC METABOLIC PANEL
BUN: 18 mg/dL (ref 6–23)
CHLORIDE: 97 meq/L (ref 96–112)
CO2: 28 meq/L (ref 19–32)
CREATININE: 1.1 mg/dL (ref 0.40–1.20)
Calcium: 9.7 mg/dL (ref 8.4–10.5)
GFR: 50.77 mL/min — ABNORMAL LOW (ref >60.00)
GLUCOSE: 92 mg/dL (ref 70–99)
Potassium: 4.3 mEq/L (ref 3.5–5.1)
Sodium: 132 mEq/L — ABNORMAL LOW (ref 135–145)

## 2016-11-05 ENCOUNTER — Encounter: Payer: Self-pay | Admitting: Family Medicine

## 2016-11-08 ENCOUNTER — Other Ambulatory Visit: Payer: Self-pay | Admitting: Family Medicine

## 2016-11-09 ENCOUNTER — Ambulatory Visit: Payer: Medicare Other | Admitting: Family Medicine

## 2016-11-18 DIAGNOSIS — H35033 Hypertensive retinopathy, bilateral: Secondary | ICD-10-CM | POA: Diagnosis not present

## 2016-11-18 DIAGNOSIS — H2513 Age-related nuclear cataract, bilateral: Secondary | ICD-10-CM | POA: Diagnosis not present

## 2016-11-18 DIAGNOSIS — I7090 Unspecified atherosclerosis: Secondary | ICD-10-CM | POA: Diagnosis not present

## 2016-11-18 DIAGNOSIS — H25013 Cortical age-related cataract, bilateral: Secondary | ICD-10-CM | POA: Diagnosis not present

## 2016-12-30 ENCOUNTER — Encounter: Payer: Self-pay | Admitting: Family Medicine

## 2016-12-30 ENCOUNTER — Ambulatory Visit (INDEPENDENT_AMBULATORY_CARE_PROVIDER_SITE_OTHER): Payer: Medicare Other | Admitting: Family Medicine

## 2016-12-30 ENCOUNTER — Other Ambulatory Visit: Payer: Self-pay | Admitting: *Deleted

## 2016-12-30 VITALS — BP 130/70 | HR 63 | Resp 12 | Ht 62.0 in | Wt 131.1 lb

## 2016-12-30 DIAGNOSIS — N183 Chronic kidney disease, stage 3 unspecified: Secondary | ICD-10-CM

## 2016-12-30 DIAGNOSIS — K219 Gastro-esophageal reflux disease without esophagitis: Secondary | ICD-10-CM

## 2016-12-30 DIAGNOSIS — M6281 Muscle weakness (generalized): Secondary | ICD-10-CM

## 2016-12-30 MED ORDER — OMEPRAZOLE 20 MG PO CPDR: 20.0000 mg | DELAYED_RELEASE_CAPSULE | Freq: Every day | ORAL | 2 refills | Status: DC

## 2016-12-30 NOTE — Progress Notes (Signed)
HPI:   ACUTE VISIT:  Chief Complaint  Patient presents with  . Gastroesophageal Reflux    Amber Baird is a 82 y.o. female, who is here today complaining of worsening "indigestion" like symptoms. + Burping frequently and occasional bloating sensation. She states that she has had these symptoms for "a long time" but worse for the past few weeks. Reporting EGD done this year for similar symptoms, "nothing wrong" found, Dx with GERD and according to pt, further recommendations were not given. Symptoms exacerbated by any food intake.No alleviating factors identified. She is currently on Zantac 150 mg tid  GI Problem  The primary symptoms include fatigue. Primary symptoms do not include fever, weight loss, abdominal pain, nausea, vomiting, melena, hematemesis, hematochezia, myalgias or rash. The illness began more than 7 days ago. The onset was gradual. The problem has been gradually worsening.  The illness is also significant for dysphagia (just for "chucky pills") and bloating. The illness does not include anorexia, odynophagia or back pain. Significant associated medical issues include GERD. Associated medical issues do not include gallstones, liver disease or alcohol abuse.   She is also concerned about Mg. She is currently on Mg 250 mg daily, decreased from 500 mg by another provider because she was having diarrhea. She states that "for a while" , years, she has had localized muscle "tiredness",currently localized on proximal aspect of UE. She denies focal weakness, numbness,or tingling. No cervical pain or limitation of ROM. She notices it when she is exercising and has to keep arms up, above head exercises. She states that started with tongue years ago,resolved with Mg supplementation after extensive work-up was done in the hospital.  She denies headache or rash. She is on Lipitor but has been on this medication for years before symptoms started.  Hx of CKD and HTN, she  is on Spironolactone 25 mg , HCTZ 25 mg daily, and Lisinopril 40 mg daily. She has not tolerated other many antihypertensive medications.  Lab Results  Component Value Date   CREATININE 1.10 11/04/2016   BUN 18 11/04/2016   NA 132 (L) 11/04/2016   K 4.3 11/04/2016   CL 97 11/04/2016   CO2 28 11/04/2016    Review of Systems  Constitutional: Positive for fatigue. Negative for appetite change, diaphoresis, fever and weight loss.  HENT: Negative for mouth sores, sore throat and voice change.   Respiratory: Negative for cough, shortness of breath and wheezing.   Cardiovascular: Negative for chest pain, palpitations and leg swelling.  Gastrointestinal: Positive for bloating and dysphagia (just for "chucky pills"). Negative for abdominal pain, anorexia, hematemesis, hematochezia, melena, nausea and vomiting.       No changes in bowel habits.  Endocrine: Negative for cold intolerance and heat intolerance.  Genitourinary: Negative for decreased urine volume and hematuria.  Musculoskeletal: Negative for back pain, joint swelling, myalgias and neck pain.  Skin: Negative for color change and rash.  Neurological: Negative for speech difficulty, weakness, numbness and headaches.  Psychiatric/Behavioral: Negative for confusion. The patient is nervous/anxious.     Current Outpatient Prescriptions on File Prior to Visit  Medication Sig Dispense Refill  . atorvastatin (LIPITOR) 40 MG tablet Take 1 tablet (40 mg total) by mouth daily. 90 tablet 2  . Biotin (BIOTIN 5000) 5 MG CAPS Take 1 capsule by mouth daily.    Marland Kitchen gabapentin (NEURONTIN) 300 MG capsule Take 1 capsule by mouth every evening.     . hydrochlorothiazide (HYDRODIURIL) 25 MG tablet Take 1  tablet (25 mg total) by mouth daily. 90 tablet 2  . lisinopril (PRINIVIL,ZESTRIL) 40 MG tablet TAKE 1 TABLET(40 MG) BY MOUTH DAILY 90 tablet 1  . spironolactone (ALDACTONE) 25 MG tablet TAKE 1 TABLET(25 MG) BY MOUTH DAILY 90 tablet 1   No current  facility-administered medications on file prior to visit.      Past Medical History:  Diagnosis Date  . Bullous pemphigoid   . Hypertension    Allergies  Allergen Reactions  . Atenolol Other (See Comments)    Drops heart rate  . Hydralazine Hcl Swelling and Other (See Comments)    Swelling on face, arms red from elbow to fingers.  . Sulfa Antibiotics Other (See Comments)    Childhood allergy   . Adhesive [Tape] Rash  . Amlodipine Rash and Other (See Comments)    flushing    Social History   Social History  . Marital status: Widowed    Spouse name: N/A  . Number of children: 1  . Years of education: N/A   Social History Main Topics  . Smoking status: Never Smoker  . Smokeless tobacco: Never Used  . Alcohol use No  . Drug use: No  . Sexual activity: Not Asked   Other Topics Concern  . None   Social History Narrative  . None    Vitals:   12/30/16 1053  BP: 130/70  Pulse: 63  Resp: 12  O2 sat at RA 98% Body mass index is 23.98 kg/m.   Physical Exam  Nursing note and vitals reviewed. Constitutional: She is oriented to person, place, and time. She appears well-developed and well-nourished. No distress.  HENT:  Head: Atraumatic.  Mouth/Throat: Oropharynx is clear and moist and mucous membranes are normal.  Eyes: Conjunctivae and EOM are normal.  Neck: No tracheal deviation present. No thyroid mass and no thyromegaly present.  Cardiovascular: Normal rate and regular rhythm.   No murmur heard. Respiratory: Effort normal and breath sounds normal. No respiratory distress.  GI: Soft. Bowel sounds are normal. She exhibits no mass. There is no hepatomegaly. There is no tenderness.  Musculoskeletal: She exhibits no edema or tenderness.  Upper extremities with no atrophy,pain with palpation or ROM,or limitation of ROM bilateral (shoulders and elbows).  Lymphadenopathy:    She has no cervical adenopathy.  Neurological: She is alert and oriented to person, place,  and time. She has normal strength. No cranial nerve deficit. Coordination and gait normal.  Skin: Skin is warm. No rash noted. No erythema.  Psychiatric: Her mood appears anxious.  Well groomed, good eye contact.    ASSESSMENT AND PLAN:   [name] was seen today for gastroesophageal reflux.  Diagnoses and all orders for this visit:  Gastroesophageal reflux disease, esophagitis presence not specified  We discussed Dx and treatment options. Also discussed other possible causes of symptoms she is reporting today. Colonoscopy reported as current. Will try to obtain copy of reports and GI visit.  PPI side effects discussed, we could try low dose and re-check renal function in a couple weeks. She is taking higher than recommended dose of Zantac, so decreased dose to bid as needed. F/U in 2 months.  -     Omeprazole (PRILOSEC) 20 MG capsule; Take 1 capsule (20 mg total) by mouth daily before breakfast.  Hypomagnesemia  No changes in current management, will follow labs and will give further recommendations accordingly.  -     Magnesium; Future  CKD (chronic kidney disease), stage III  No changes  in current management. Continue low salt diet and avoiding NSAID's. Further recommendations will be given according to lab results.   -     Basic metabolic panel; Future  Muscle weakness  Today examination negative otherwise. Lipitor could also contribute to problem. Instructed about warning signs.  -     CK; Future       -     Sedimentation rate    -Ms.Melynda Keller advised to seek immediate medical attention is symptoms suddenly get worse.      Maxx Pham G. Martinique, MD  [facility]. [place] office.

## 2016-12-30 NOTE — Patient Instructions (Signed)
A few things to remember from today's visit:   Hypomagnesemia - Plan: Magnesium  Gastroesophageal reflux disease, esophagitis presence not specified - Plan: omeprazole (PRILOSEC) 20 MG capsule  CKD (chronic kidney disease), stage III - Plan: Basic metabolic panel  Muscle weakness - Plan: CK    Avoid foods that make your symptoms worse, for example coffee, chocolate,pepermeint,alcohol, and greasy food. Raising the head of your bed about 6 inches may help with nocturnal symptoms.   Weight loss (if you are overweight). Avoid lying down for 3 hours after eating.  Instead 3 large meals daily try small and more frequent meals during the day.   You should be evaluated immediately if bloody vomiting, bloody stools, black stools (like tar), difficulty swallowing, food gets stuck on the way down or choking when eating. Abnormal weight loss or severe abdominal pain.  If symptoms are not resolved sometimes endoscopy is necessary.  Please be sure medication list is accurate. If a new problem present, please set up appointment sooner than planned today.

## 2017-01-13 ENCOUNTER — Other Ambulatory Visit (INDEPENDENT_AMBULATORY_CARE_PROVIDER_SITE_OTHER): Payer: Medicare Other

## 2017-01-13 DIAGNOSIS — N183 Chronic kidney disease, stage 3 unspecified: Secondary | ICD-10-CM

## 2017-01-13 DIAGNOSIS — M6281 Muscle weakness (generalized): Secondary | ICD-10-CM

## 2017-01-13 LAB — BASIC METABOLIC PANEL
BUN: 16 mg/dL (ref 6–23)
CALCIUM: 9.7 mg/dL (ref 8.4–10.5)
CHLORIDE: 92 meq/L — AB (ref 96–112)
CO2: 27 meq/L (ref 19–32)
CREATININE: 0.99 mg/dL (ref 0.40–1.20)
GFR: 57.31 mL/min — ABNORMAL LOW (ref >60.00)
Glucose, Bld: 92 mg/dL (ref 70–99)
Potassium: 4.1 mEq/L (ref 3.5–5.1)
Sodium: 127 mEq/L — ABNORMAL LOW (ref 135–145)

## 2017-01-13 LAB — MAGNESIUM: Magnesium: 1.6 mg/dL (ref 1.5–2.5)

## 2017-01-13 LAB — CK: CK TOTAL: 77 U/L (ref 7–177)

## 2017-01-13 LAB — SEDIMENTATION RATE: Sed Rate: 2 mm/hr (ref 0–30)

## 2017-01-14 ENCOUNTER — Encounter: Payer: Self-pay | Admitting: Family Medicine

## 2017-02-11 ENCOUNTER — Telehealth: Payer: Self-pay

## 2017-02-11 NOTE — Telephone Encounter (Signed)
Spoke with pt and advised. She will monitor symptoms and seek care if needed. Nothing further needed at this time.

## 2017-02-11 NOTE — Telephone Encounter (Signed)
HR is still in normal range, < 100/min. Continue monitoring, avoid working outdoors for now because she can get dehydrated, and if HR > 100/min or new associated symptoms she may need to be evaluated before her next OV, If chest pain, syncope/near syncope,dyspnea,or MS changes she needs to go to the ER, call 911.  Thanks, BJ

## 2017-02-11 NOTE — Telephone Encounter (Signed)
Patient called with c/o elevated heart rate for several days. She reports that it has maintained in the high 80's-90 consistently. She states that her BP has been normal. She also states that she has been very hot and has had increased fatigue. She denies any new onset headache, vision changes or any other symptoms. She would like to know what you would recommend.  Dr. Martinique - Please advise. Thanks!

## 2017-02-28 NOTE — Progress Notes (Signed)
HPI:   [name] is a 82 y.o. female, who is here today to follow on some chronic medical problems.  She was last seen on 12/30/16 for acute visit, she was c/o GERD like symptoms. She has followed with GI, reported EGD done this year ( I do not have report). She was taking Zantac 450 mg daily, it was decreased to 150 mg bid. Omeprazole 20 mg added. GERD symptoms resolved "100%." Tolerating medication well. She is not longer taking Tums.   She was also c/o proximal UE "tiredness" sensation, intermittently for year, but no focal weakness. She was attributing symptoms to decreasing Mg dose from 500 mg to 250 mg. Mg was slightly low, so Mg Oxide dose increased from 250 mg daily to bid. She increased Mg dose gradually because it causes mild diarrhea, has been on 500 mg for 2-3 days and tolerating better."Tiredness" sensation has improved.    Hypertension:   Currently on HCTZ  25 mg, Spironolactone 25 mg daily, and Lisinopril 40 mg daily. She did not tolerate Amlodipine,Clonidine, and developed bradycardia with BB. She is following a low salt diet. Home BP's 130's/80's.  She is taking medications as instructed, no side effects reported.  Denies headache, visual changes, exertional chest pain, dyspnea, or edema.  She has had occasional palpitations for the past "few months", 2-3 months. She feels her heart racing in the meddle of the night, deneis skipping beats or irregular HR.She is not sure about duration because she falls asleep. Denies associated diaphoresis,dizziness,or chest pain. She does not have palpitations during the day or with exertion.  HypoNa++ She has restricted oral fluids as instructed but in general she does not think she drinks "a lot of" water. States that her sister also suffers from low Na++.  Lab Results  Component Value Date   CREATININE 0.99 01/13/2017   BUN 16 01/13/2017   NA 127 (L) 01/13/2017   K 4.1 01/13/2017   CL 92 (L)  01/13/2017   CO2 27 01/13/2017   CKD III, she has not noted gross hematuria or foam in urine.  Insomnia:  Currently she is on Gabapentin 300 mg at bedtime.Still helping with sleep. Sleeps about 6-7 hours, she feels rested next day.   Review of Systems  Constitutional: Negative for activity change, appetite change, fatigue, fever and unexpected weight change.  HENT: Negative for mouth sores, nosebleeds and trouble swallowing.   Eyes: Negative for redness and visual disturbance.  Respiratory: Negative for cough, shortness of breath and wheezing.   Cardiovascular: Positive for palpitations. Negative for chest pain and leg swelling.  Gastrointestinal: Negative for abdominal pain, nausea and vomiting.       Negative for changes in bowel habits.  Endocrine: Negative for cold intolerance and heat intolerance.  Genitourinary: Negative for decreased urine volume, dysuria and hematuria.  Musculoskeletal: Negative for gait problem.  Skin: Negative for pallor and rash.  Neurological: Negative for dizziness, syncope, weakness and headaches.  Psychiatric/Behavioral: Negative for confusion and sleep disturbance. The patient is nervous/anxious.       Current Outpatient Prescriptions on File Prior to Visit  Medication Sig Dispense Refill  . atorvastatin (LIPITOR) 40 MG tablet Take 1 tablet (40 mg total) by mouth daily. 90 tablet 2  . Biotin (BIOTIN 5000) 5 MG CAPS Take 1 capsule by mouth daily.    . hydrochlorothiazide (HYDRODIURIL) 25 MG tablet Take 1 tablet (25 mg total) by mouth daily. 90 tablet 2  . lisinopril (PRINIVIL,ZESTRIL) 40 MG  tablet TAKE 1 TABLET(40 MG) BY MOUTH DAILY 90 tablet 1  . omeprazole (PRILOSEC) 20 MG capsule Take 1 capsule (20 mg total) by mouth daily before breakfast. 90 capsule 2  . spironolactone (ALDACTONE) 25 MG tablet TAKE 1 TABLET(25 MG) BY MOUTH DAILY 90 tablet 1   No current facility-administered medications on file prior to visit.      Past Medical History:    Diagnosis Date  . Bullous pemphigoid   . Hypertension    Allergies  Allergen Reactions  . Atenolol Other (See Comments)    Drops heart rate  . Hydralazine Hcl Swelling and Other (See Comments)    Swelling on face, arms red from elbow to fingers.  . Sulfa Antibiotics Other (See Comments)    Childhood allergy   . Adhesive [Tape] Rash  . Amlodipine Rash and Other (See Comments)    flushing    Social History   Social History  . Marital status: Widowed    Spouse name: N/A  . Number of children: 1  . Years of education: N/A   Social History Main Topics  . Smoking status: Never Smoker  . Smokeless tobacco: Never Used  . Alcohol use No  . Drug use: No  . Sexual activity: Not Asked   Other Topics Concern  . None   Social History Narrative  . None    Vitals:   03/01/17 0926  BP: 124/80  Pulse: 63  Resp: 12  O2 sat at RA 97% Body mass index is 24.05 kg/m.   Physical Exam  Nursing note and vitals reviewed. Constitutional: She is oriented to person, place, and time. She appears well-developed and well-nourished. No distress.  HENT:  Head: Atraumatic.  Mouth/Throat: Oropharynx is clear and moist and mucous membranes are normal.  Eyes: Pupils are equal, round, and reactive to light. Conjunctivae and EOM are normal.  Cardiovascular: Normal rate and regular rhythm.   No murmur heard. Pulses:      Dorsalis pedis pulses are 2+ on the right side, and 2+ on the left side.  Respiratory: Effort normal and breath sounds normal. No respiratory distress.  GI: Soft. She exhibits no mass. There is no hepatomegaly. There is no tenderness.  Musculoskeletal: She exhibits no edema.  Lymphadenopathy:    She has no cervical adenopathy.  Neurological: She is alert and oriented to person, place, and time. She has normal strength. Coordination normal.  Skin: Skin is warm. No erythema.  Psychiatric: She has a normal mood and affect.  Well groomed, good eye contact.     ASSESSMENT  AND PLAN:   [name] was seen today for follow-up.  Diagnoses and all orders for this visit:    Chemistry      Component Value Date/Time   NA 132 (L) 03/01/2017 1010   K 4.2 03/01/2017 1010   CL 96 03/01/2017 1010   CO2 29 03/01/2017 1010   BUN 24 (H) 03/01/2017 1010   CREATININE 0.99 03/01/2017 1010      Component Value Date/Time   CALCIUM 9.7 03/01/2017 1010   ALKPHOS 77 03/01/2017 1010   AST 18 03/01/2017 1010   ALT 26 03/01/2017 1010   BILITOT 0.6 03/01/2017 1010     Lab Results  Component Value Date   TSH 2.27 03/01/2017    Insomnia, unspecified type  Stable. Good sleep hygiene. No changes in current management. F/U in 6 months.  -     gabapentin (NEURONTIN) 300 MG capsule; Take 1 capsule (300 mg total)  by mouth every evening.  Gastroesophageal reflux disease, esophagitis presence not specified  Improved. GERD precautions to continue. No changes in Omeprazole. F/U in 12 months.  Essential hypertension  Adequately controlled. No changes in current management. DASH diet recommended. Eye exam is current. F/U in 6 months, before if needed.  -     Comprehensive metabolic panel -     TSH  CKD (chronic kidney disease), stage III  Adequate BP control, low salt diet, avoid NSAID's, and adequate hydration. Continue Lisinopril.  -     Comprehensive metabolic panel  Hyponatremia  We discussed possible causes. Some of her meds can aggravate problem. Caution with fluid intake. Further recommendations will be given according to lab results.   -     Osmolality, urine -     Osmolality -     Sodium, urine, random -     Comprehensive metabolic panel     -[name] was advised to return sooner than planned today if new concerns arise.       Betty G. Martinique, MD  [facility]. [place] office.

## 2017-03-01 ENCOUNTER — Encounter: Payer: Self-pay | Admitting: Family Medicine

## 2017-03-01 ENCOUNTER — Ambulatory Visit (INDEPENDENT_AMBULATORY_CARE_PROVIDER_SITE_OTHER): Payer: Medicare Other | Admitting: Family Medicine

## 2017-03-01 VITALS — BP 124/80 | HR 63 | Resp 12 | Ht 62.0 in | Wt 131.5 lb

## 2017-03-01 DIAGNOSIS — K219 Gastro-esophageal reflux disease without esophagitis: Secondary | ICD-10-CM | POA: Diagnosis not present

## 2017-03-01 DIAGNOSIS — E871 Hypo-osmolality and hyponatremia: Secondary | ICD-10-CM | POA: Diagnosis not present

## 2017-03-01 DIAGNOSIS — N183 Chronic kidney disease, stage 3 unspecified: Secondary | ICD-10-CM

## 2017-03-01 DIAGNOSIS — I10 Essential (primary) hypertension: Secondary | ICD-10-CM | POA: Diagnosis not present

## 2017-03-01 DIAGNOSIS — G4700 Insomnia, unspecified: Secondary | ICD-10-CM

## 2017-03-01 LAB — COMPREHENSIVE METABOLIC PANEL
ALT: 26 U/L (ref 0–35)
AST: 18 U/L (ref 0–37)
Albumin: 4.4 g/dL (ref 3.5–5.2)
Alkaline Phosphatase: 77 U/L (ref 39–117)
BILIRUBIN TOTAL: 0.6 mg/dL (ref 0.2–1.2)
BUN: 24 mg/dL — AB (ref 6–23)
CHLORIDE: 96 meq/L (ref 96–112)
CO2: 29 meq/L (ref 19–32)
Calcium: 9.7 mg/dL (ref 8.4–10.5)
Creatinine, Ser: 0.99 mg/dL (ref 0.40–1.20)
GFR: 57.29 mL/min — ABNORMAL LOW (ref >60.00)
Glucose, Bld: 90 mg/dL (ref 70–99)
POTASSIUM: 4.2 meq/L (ref 3.5–5.1)
Sodium: 132 mEq/L — ABNORMAL LOW (ref 135–145)
Total Protein: 6.5 g/dL (ref 6.0–8.3)

## 2017-03-01 LAB — TSH: TSH: 2.27 u[IU]/mL (ref 0.35–4.50)

## 2017-03-01 MED ORDER — GABAPENTIN 300 MG PO CAPS: 300.0000 mg | ORAL_CAPSULE | Freq: Every evening | ORAL | 2 refills | Status: DC

## 2017-03-01 NOTE — Patient Instructions (Signed)
A few things to remember from today's visit:   Essential hypertension - Plan: Comprehensive metabolic panel, TSH  CKD (chronic kidney disease), stage III - Plan: Comprehensive metabolic panel  Gastroesophageal reflux disease, esophagitis presence not specified  Hyponatremia - Plan: Osmolality, urine, Osmolality, Sodium, urine, random, Comprehensive metabolic panel  Insomnia, unspecified type - Plan: gabapentin (NEURONTIN) 300 MG capsule   Please be sure medication list is accurate. If a new problem present, please set up appointment sooner than planned today.

## 2017-03-02 LAB — OSMOLALITY: OSMOLALITY: 281 mosm/kg (ref 278–305)

## 2017-03-02 LAB — OSMOLALITY, URINE: OSMOLALITY UR: 381 mosm/kg (ref 50–1200)

## 2017-03-02 LAB — SODIUM, URINE, RANDOM: Sodium, Ur: 83 mmol/L (ref 28–272)

## 2017-03-03 ENCOUNTER — Encounter: Payer: Self-pay | Admitting: Family Medicine

## 2017-03-15 ENCOUNTER — Other Ambulatory Visit: Payer: Self-pay | Admitting: Family Medicine

## 2017-03-15 DIAGNOSIS — I10 Essential (primary) hypertension: Secondary | ICD-10-CM

## 2017-03-24 DIAGNOSIS — M4125 Other idiopathic scoliosis, thoracolumbar region: Secondary | ICD-10-CM | POA: Diagnosis not present

## 2017-03-24 DIAGNOSIS — M419 Scoliosis, unspecified: Secondary | ICD-10-CM | POA: Diagnosis not present

## 2017-03-24 DIAGNOSIS — I10 Essential (primary) hypertension: Secondary | ICD-10-CM | POA: Diagnosis not present

## 2017-03-24 DIAGNOSIS — M47816 Spondylosis without myelopathy or radiculopathy, lumbar region: Secondary | ICD-10-CM | POA: Diagnosis not present

## 2017-03-24 DIAGNOSIS — Z6825 Body mass index (BMI) 25.0-25.9, adult: Secondary | ICD-10-CM | POA: Diagnosis not present

## 2017-04-12 DIAGNOSIS — M47816 Spondylosis without myelopathy or radiculopathy, lumbar region: Secondary | ICD-10-CM | POA: Diagnosis not present

## 2017-04-18 DIAGNOSIS — Z1231 Encounter for screening mammogram for malignant neoplasm of breast: Secondary | ICD-10-CM | POA: Diagnosis not present

## 2017-04-21 ENCOUNTER — Encounter: Payer: Self-pay | Admitting: Family Medicine

## 2017-04-21 DIAGNOSIS — N6489 Other specified disorders of breast: Secondary | ICD-10-CM | POA: Diagnosis not present

## 2017-04-21 LAB — HM MAMMOGRAPHY

## 2017-04-22 ENCOUNTER — Encounter: Payer: Self-pay | Admitting: Family Medicine

## 2017-05-02 DIAGNOSIS — M47816 Spondylosis without myelopathy or radiculopathy, lumbar region: Secondary | ICD-10-CM | POA: Diagnosis not present

## 2017-05-04 ENCOUNTER — Encounter: Payer: Self-pay | Admitting: Family Medicine

## 2017-05-08 ENCOUNTER — Other Ambulatory Visit: Payer: Self-pay | Admitting: Family Medicine

## 2017-05-14 ENCOUNTER — Other Ambulatory Visit: Payer: Self-pay | Admitting: Family Medicine

## 2017-05-14 DIAGNOSIS — I10 Essential (primary) hypertension: Secondary | ICD-10-CM

## 2017-07-12 ENCOUNTER — Other Ambulatory Visit: Payer: Self-pay | Admitting: Family Medicine

## 2017-07-12 DIAGNOSIS — E782 Mixed hyperlipidemia: Secondary | ICD-10-CM

## 2017-08-15 DIAGNOSIS — M47816 Spondylosis without myelopathy or radiculopathy, lumbar region: Secondary | ICD-10-CM | POA: Diagnosis not present

## 2017-08-30 NOTE — Progress Notes (Signed)
HPI:   Amber Baird is a 83 y.o. female, who is here today for 6 months follow up.   She was last seen on 03/01/17.  HTN:  Currently she is on HCTZ 25 mg, Spironolactone 25 mg daily, and Lisinopril 40 mg daily. She has not tolerated other antihypertensive medications: Amlodipine,BB's,Clonidine among some.  Denies headache, visual changes, chest pain, dyspnea, palpitation, focal weakness, or edema. Last eye exam within a year, next appt 10/2017.   She would like to decrease dose of Lisinopril because hair loss.    Hair is becoming thinner, she feels like it has has improved in the past few weeks. No areas of alopecia.   + CKD III She has not noted gross hematuria,decreased urine output, or foam in urine.  Lab Results  Component Value Date   CREATININE 0.99 03/01/2017   BUN 24 (H) 03/01/2017   NA 132 (L) 03/01/2017   K 4.2 03/01/2017   CL 96 03/01/2017   CO2 29 03/01/2017   HypoNa++, she is limiting fluid intake.  HypoMg, she is on Mg oxide 200 mg bid.  GERD: She is on Omeprazole 20 mg daily.  Denies abdominal pain, nausea, vomiting, changes in bowel habits, blood in stool or melena.   Hyperlipidemia:  Currently on Atorvastatin 40 mg daily. Following a low fat diet: Yes..  She has not noted side effects with medication.  Lab Results  Component Value Date   CHOL 223 (H) 08/23/2016   HDL 61.00 08/23/2016   LDLCALC 129 (H) 08/23/2016   TRIG 163.0 (H) 08/23/2016   CHOLHDL 4 08/23/2016    Insomnia: She is on Gabapentin 300 mg at bedtime.  Sleeping about 7 hours. She feels rested in the morning.   New concern:   Back pain: She has had lower back pain for years, reports Hx of "pinch nerve" on lower back. She started taking Gabapentin because radicular pain then continue because it also helped her sleep better.  According to pt, she just had an "ablation" 08/15/17, which did not help much with pain. Lower back pain is not radiated. On  09/15/17 is her next appt.  Pain is exacerbated by prolonged standing and bending over. She has pain "almost daily" and max 8/10.  OTC medication ,including NSAID's years ago,did not help.    Review of Systems  Constitutional: Negative for activity change, appetite change, fatigue, fever and unexpected weight change.  HENT: Negative for mouth sores, nosebleeds and trouble swallowing.   Eyes: Negative for redness and visual disturbance.  Respiratory: Negative for cough, shortness of breath and wheezing.   Cardiovascular: Negative for chest pain, palpitations and leg swelling.  Gastrointestinal: Negative for abdominal pain, nausea and vomiting.       Negative for changes in bowel habits.  Endocrine: Negative for cold intolerance, heat intolerance, polydipsia, polyphagia and polyuria.  Genitourinary: Negative for decreased urine volume, dysuria and hematuria.  Musculoskeletal: Positive for back pain. Negative for gait problem.  Skin: Negative for rash and wound.  Neurological: Negative for syncope, weakness and headaches.  Psychiatric/Behavioral: Negative for confusion. The patient is nervous/anxious.      Current Outpatient Medications on File Prior to Visit  Medication Sig Dispense Refill  . atorvastatin (LIPITOR) 40 MG tablet TAKE 1 TABLET DAILY 90 tablet 2  . Biotin (BIOTIN 5000) 5 MG CAPS Take 1 capsule by mouth daily.    Marland Kitchen gabapentin (NEURONTIN) 300 MG capsule Take 1 capsule (300 mg total) by mouth every evening. [place]  capsule 2  . hydrochlorothiazide (HYDRODIURIL) 25 MG tablet TAKE 1 TABLET DAILY 90 tablet 1  . Magnesium 400 MG CAPS Take 400 mg by mouth daily.    . Multiple Vitamin (MULTIVITAMIN) tablet Take 1 tablet by mouth daily.    Marland Kitchen omeprazole (PRILOSEC) 20 MG capsule Take 1 capsule (20 mg total) by mouth daily before breakfast. 90 capsule 2  . spironolactone (ALDACTONE) 25 MG tablet TAKE 1 TABLET(25 MG) BY MOUTH DAILY 90 tablet 1   No current facility-administered  medications on file prior to visit.      Past Medical History:  Diagnosis Date  . Bullous pemphigoid   . Hypertension    Allergies  Allergen Reactions  . Atenolol Other (See Comments)    Drops heart rate  . Hydralazine Hcl Swelling and Other (See Comments)    Swelling on face, arms red from elbow to fingers.  . Sulfa Antibiotics Other (See Comments)    Childhood allergy   . Adhesive [Tape] Rash  . Amlodipine Rash and Other (See Comments)    flushing    Social History   Socioeconomic History  . Marital status: Widowed    Spouse name: None  . Number of children: 1  . Years of education: None  . Highest education level: None  Social Needs  . Financial resource strain: None  . Food insecurity - worry: None  . Food insecurity - inability: None  . Transportation needs - medical: None  . Transportation needs - non-medical: None  Occupational History  . None  Tobacco Use  . Smoking status: Never Smoker  . Smokeless tobacco: Never Used  Substance and Sexual Activity  . Alcohol use: No    Alcohol/week: 0.0 oz  . Drug use: No  . Sexual activity: None  Other Topics Concern  . None  Social History Narrative  . None    Vitals:   08/31/17 0905  BP: 122/70  Pulse: 77  Resp: 12  Temp: 98 F (36.7 C)  SpO2: 100%   Body mass index is 24.51 kg/m.   Physical Exam  Nursing note and vitals reviewed. Constitutional: She is oriented to person, place, and time. She appears well-developed and well-nourished. No distress.  HENT:  Head: Normocephalic and atraumatic.  Mouth/Throat: Oropharynx is clear and moist and mucous membranes are normal.  Eyes: Conjunctivae and EOM are normal. Pupils are equal, round, and reactive to light.  Cardiovascular: Normal rate and regular rhythm.  No murmur heard. Pulses:      Dorsalis pedis pulses are 2+ on the right side, and 2+ on the left side.  Respiratory: Effort normal and breath sounds normal. No respiratory distress.  GI: Soft.  She exhibits no mass. There is no hepatomegaly. There is no tenderness.  Musculoskeletal: She exhibits no edema.       Lumbar back: She exhibits tenderness. She exhibits no bony tenderness.  Pain on left lower back with palpation and when lying on examination table.   Lymphadenopathy:    She has no cervical adenopathy.  Neurological: She is alert and oriented to person, place, and time. She has normal strength.  SLR negative bilateral. Stable gait with no assistance.  Skin: Skin is warm. No erythema.  Psychiatric: Her mood appears anxious.  Well groomed, good eye contact.    ASSESSMENT AND PLAN:   Ms. Quiera Diffee was seen today for 6 months follow-up.  Orders Placed This Encounter  Procedures  . Basic metabolic panel  . Lipid panel  .  Magnesium  . Microalbumin / creatinine urine ratio     Hypomagnesemia She will continue Mg Oxide 200 mg bid. Further recommendations will be given according to lab results.     Hypercholesteremia No changes in current management, will follow labs and will give further recommendations accordingly. Continue low fat diet and Atorvastatin.    CKD (chronic kidney disease), stage III Continue low salt diet and Lisinopril. Avoid NSAID's. Low salt diet. Adequate BP control.  Insomnia Stable. No changes in current management. Side effects of Gabapentin discussed. Good sleep hygiene also recommended.    Essential hypertension Adequately controlled. She would like to decrease Lisinopril,so Lisinopril 30 mg sent to her local pharmacy. She will continue monitoring BP. No changes in Spironolactone or HCTZ. DASH-low salt diet to continue. Eye exam is current.  F/U in 5-6 months, before if needed.   GERD (gastroesophageal reflux disease) Well controlled. No changes in current management. GERD precautions discussed. F/U in 6-12 months.  Chronic low back pain, unspecified back pain laterality, with sciatica presence  unspecified  She does not want to try higher dose of Gabapentin. Fall precautions discussed. Continue following with ortho.      Jermya Dowding G. Martinique, MD  [facility] [place] office.

## 2017-08-31 ENCOUNTER — Other Ambulatory Visit: Payer: Self-pay | Admitting: Family Medicine

## 2017-08-31 ENCOUNTER — Ambulatory Visit (INDEPENDENT_AMBULATORY_CARE_PROVIDER_SITE_OTHER): Payer: Medicare Other | Admitting: Family Medicine

## 2017-08-31 ENCOUNTER — Encounter: Payer: Self-pay | Admitting: Family Medicine

## 2017-08-31 VITALS — BP 122/70 | HR 77 | Temp 98.0°F | Resp 12 | Ht 62.0 in | Wt 134.0 lb

## 2017-08-31 DIAGNOSIS — K219 Gastro-esophageal reflux disease without esophagitis: Secondary | ICD-10-CM | POA: Diagnosis not present

## 2017-08-31 DIAGNOSIS — G8929 Other chronic pain: Secondary | ICD-10-CM | POA: Insufficient documentation

## 2017-08-31 DIAGNOSIS — E7800 Pure hypercholesterolemia, unspecified: Secondary | ICD-10-CM | POA: Diagnosis not present

## 2017-08-31 DIAGNOSIS — I10 Essential (primary) hypertension: Secondary | ICD-10-CM

## 2017-08-31 DIAGNOSIS — M545 Low back pain: Secondary | ICD-10-CM

## 2017-08-31 DIAGNOSIS — N183 Chronic kidney disease, stage 3 unspecified: Secondary | ICD-10-CM

## 2017-08-31 DIAGNOSIS — G4700 Insomnia, unspecified: Secondary | ICD-10-CM

## 2017-08-31 MED ORDER — LISINOPRIL 30 MG PO TABS: 30.0000 mg | ORAL_TABLET | Freq: Every day | ORAL | 1 refills | Status: DC

## 2017-08-31 NOTE — Assessment & Plan Note (Signed)
Stable. No changes in current management. Side effects of Gabapentin discussed. Good sleep hygiene also recommended.

## 2017-08-31 NOTE — Assessment & Plan Note (Signed)
Well controlled. No changes in current management. GERD precautions discussed. F/U in 6-12 months.

## 2017-08-31 NOTE — Assessment & Plan Note (Signed)
No changes in current management, will follow labs and will give further recommendations accordingly. Continue low fat diet and Atorvastatin.

## 2017-08-31 NOTE — Assessment & Plan Note (Signed)
Adequately controlled. She would like to decrease Lisinopril,so Lisinopril 30 mg sent to her local pharmacy. She will continue monitoring BP. No changes in Spironolactone or HCTZ. DASH-low salt diet to continue. Eye exam is current.  F/U in 5-6 months, before if needed.

## 2017-08-31 NOTE — Assessment & Plan Note (Signed)
She will continue Mg Oxide 200 mg bid. Further recommendations will be given according to lab results.

## 2017-08-31 NOTE — Patient Instructions (Addendum)
A few things to remember from today's visit:   Gastroesophageal reflux disease, esophagitis presence not specified  Insomnia, unspecified type  CKD (chronic kidney disease), stage III ([place]) - Plan: Basic metabolic panel, Microalbumin / creatinine urine ratio  Hypercholesteremia - Plan: Lipid panel  Essential hypertension - Plan: Basic metabolic panel  Hypomagnesemia - Plan: Magnesium  Today Lisinopril decreased to 30 mg, rest unchanged. Tomorrow labs.  Keep follow up appt with ortho.   Please be sure medication list is accurate. If a new problem present, please set up appointment sooner than planned today.

## 2017-08-31 NOTE — Assessment & Plan Note (Signed)
Continue low salt diet and Lisinopril. Avoid NSAID's. Low salt diet. Adequate BP control.

## 2017-09-01 ENCOUNTER — Encounter: Payer: Self-pay | Admitting: Family Medicine

## 2017-09-01 LAB — LIPID PANEL
Cholesterol: 153 mg/dL (ref 0–200)
HDL: 58.5 mg/dL (ref >39.00)
LDL CALC: 79 mg/dL (ref 0–99)
NonHDL: 94.99
TRIGLYCERIDES: 82 mg/dL (ref 0.0–149.0)
Total CHOL/HDL Ratio: 3
VLDL: 16.4 mg/dL (ref 0.0–40.0)

## 2017-09-01 LAB — BASIC METABOLIC PANEL
BUN: 18 mg/dL (ref 6–23)
CALCIUM: 9.2 mg/dL (ref 8.4–10.5)
CO2: 30 meq/L (ref 19–32)
Chloride: 100 mEq/L (ref 96–112)
Creatinine, Ser: 0.94 mg/dL (ref 0.40–1.20)
GFR: 60.75 mL/min (ref >60.00)
Glucose, Bld: 81 mg/dL (ref 70–99)
POTASSIUM: 4.4 meq/L (ref 3.5–5.1)
SODIUM: 136 meq/L (ref 135–145)

## 2017-09-01 LAB — MICROALBUMIN / CREATININE URINE RATIO
Creatinine,U: 80.4 mg/dL
MICROALB UR: 0.7 mg/dL (ref 0.0–1.9)
Microalb Creat Ratio: 0.9 mg/g (ref 0.0–30.0)

## 2017-09-01 LAB — MAGNESIUM: MAGNESIUM: 1.8 mg/dL (ref 1.5–2.5)

## 2017-09-13 DIAGNOSIS — M4125 Other idiopathic scoliosis, thoracolumbar region: Secondary | ICD-10-CM | POA: Diagnosis not present

## 2017-09-13 DIAGNOSIS — Z6825 Body mass index (BMI) 25.0-25.9, adult: Secondary | ICD-10-CM | POA: Diagnosis not present

## 2017-09-13 DIAGNOSIS — M545 Low back pain: Secondary | ICD-10-CM | POA: Diagnosis not present

## 2017-09-13 DIAGNOSIS — M47896 Other spondylosis, lumbar region: Secondary | ICD-10-CM | POA: Diagnosis not present

## 2017-09-14 ENCOUNTER — Other Ambulatory Visit: Payer: Self-pay | Admitting: Family Medicine

## 2017-09-14 DIAGNOSIS — K219 Gastro-esophageal reflux disease without esophagitis: Secondary | ICD-10-CM

## 2017-10-10 DIAGNOSIS — M5136 Other intervertebral disc degeneration, lumbar region: Secondary | ICD-10-CM | POA: Diagnosis not present

## 2017-10-28 ENCOUNTER — Ambulatory Visit: Payer: Medicare Other

## 2017-11-01 DIAGNOSIS — M47896 Other spondylosis, lumbar region: Secondary | ICD-10-CM | POA: Diagnosis not present

## 2017-11-01 DIAGNOSIS — M5136 Other intervertebral disc degeneration, lumbar region: Secondary | ICD-10-CM | POA: Diagnosis not present

## 2017-11-01 DIAGNOSIS — M545 Low back pain: Secondary | ICD-10-CM | POA: Diagnosis not present

## 2017-11-03 ENCOUNTER — Other Ambulatory Visit: Payer: Self-pay | Admitting: Family Medicine

## 2017-11-10 ENCOUNTER — Other Ambulatory Visit: Payer: Self-pay | Admitting: Family Medicine

## 2017-11-10 DIAGNOSIS — I10 Essential (primary) hypertension: Secondary | ICD-10-CM

## 2017-11-22 DIAGNOSIS — M461 Sacroiliitis, not elsewhere classified: Secondary | ICD-10-CM | POA: Diagnosis not present

## 2017-11-23 DIAGNOSIS — H2513 Age-related nuclear cataract, bilateral: Secondary | ICD-10-CM | POA: Diagnosis not present

## 2017-11-23 DIAGNOSIS — H35033 Hypertensive retinopathy, bilateral: Secondary | ICD-10-CM | POA: Diagnosis not present

## 2017-11-23 DIAGNOSIS — I7090 Unspecified atherosclerosis: Secondary | ICD-10-CM | POA: Diagnosis not present

## 2017-11-23 DIAGNOSIS — H25013 Cortical age-related cataract, bilateral: Secondary | ICD-10-CM | POA: Diagnosis not present

## 2017-12-06 IMAGING — DX DG CHEST 1V PORT
1 series · 1 of 1 positions shown · non-contrast
Comparison: Portable exam 0342 hours compared to 08/03/2006

CLINICAL DATA: Hypertension, tremor, near syncope

EXAM:
PORTABLE CHEST 1 VIEW

[chest ap]
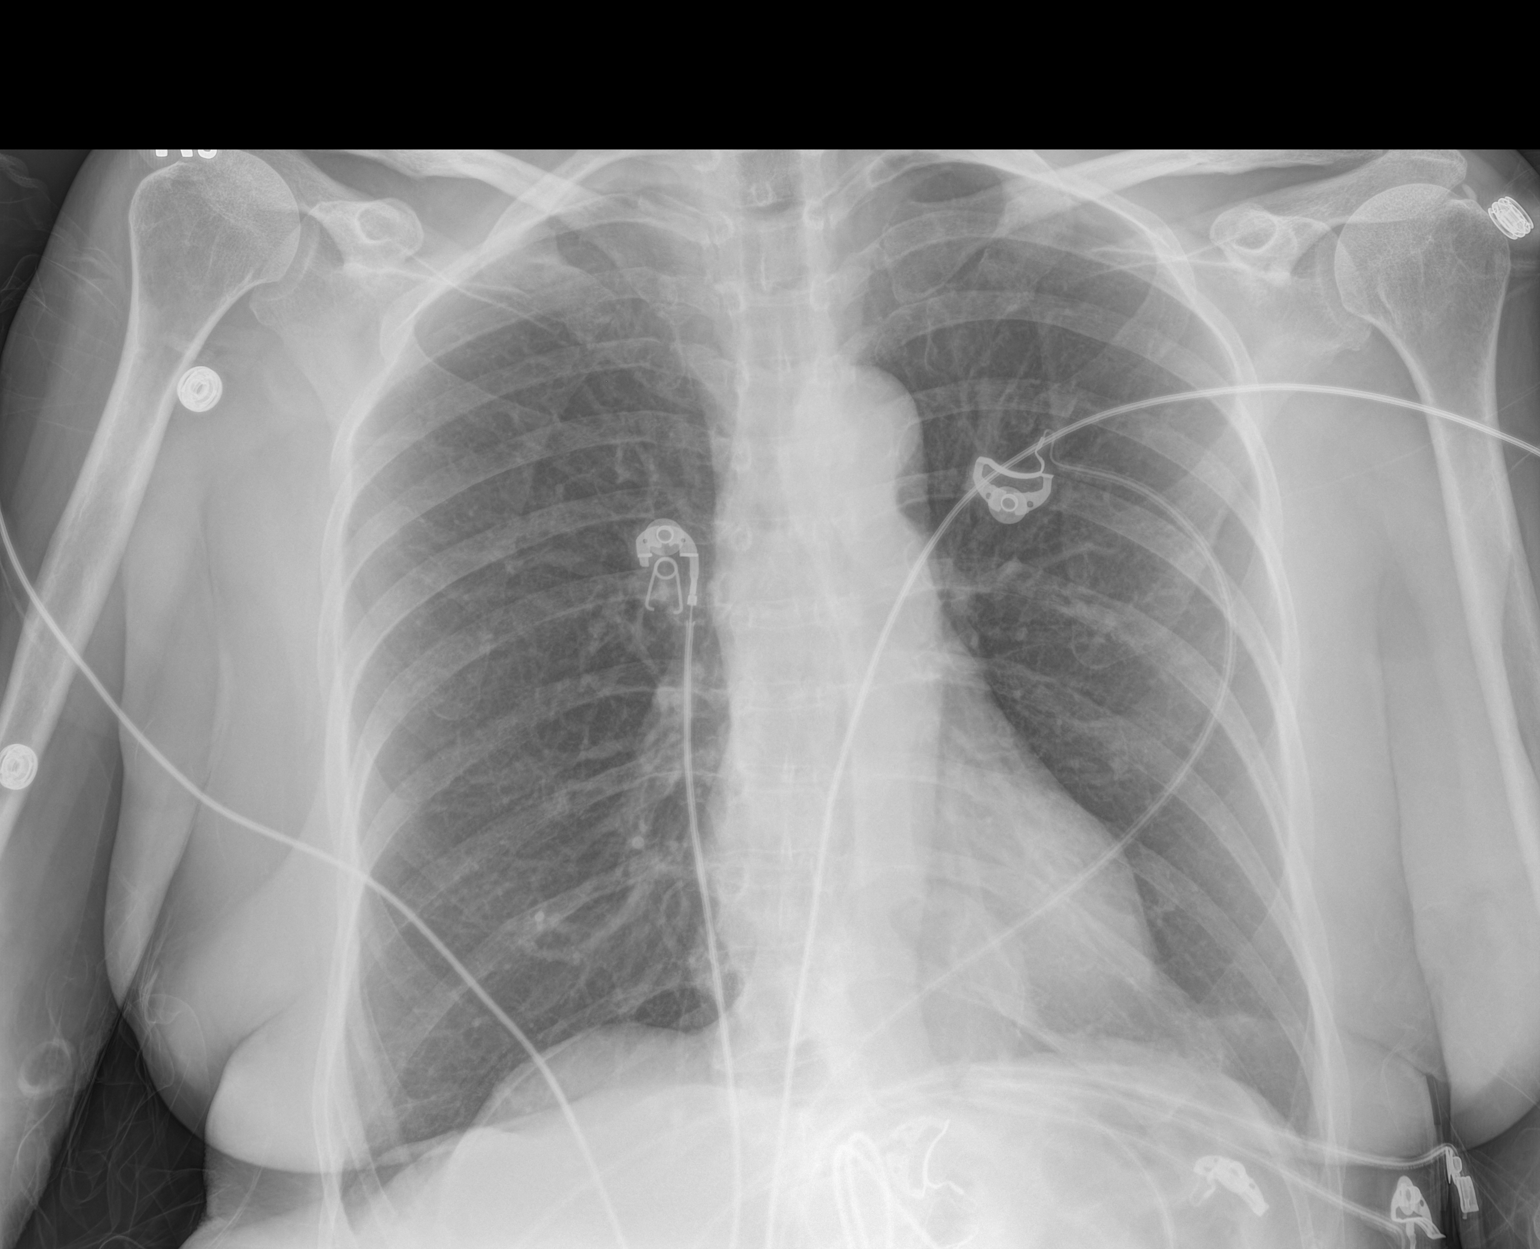

[1 of 1 positions shown; findings below may reference images not displayed]

FINDINGS: Normal heart size and pulmonary vascularity.

Small hiatal hernia.

Mediastinal contours otherwise normal.

Lungs appear hyperinflated but clear.

No pulmonary infiltrate, pleural effusion or pneumothorax.

Osseous demineralization.
IMPRESSION: Question emphysematous changes.

Small hiatal hernia.

No acute abnormalities.

## 2017-12-11 DIAGNOSIS — M25531 Pain in right wrist: Secondary | ICD-10-CM | POA: Diagnosis not present

## 2017-12-12 ENCOUNTER — Ambulatory Visit (INDEPENDENT_AMBULATORY_CARE_PROVIDER_SITE_OTHER): Payer: Medicare Other | Admitting: Family Medicine

## 2017-12-12 ENCOUNTER — Encounter: Payer: Self-pay | Admitting: Family Medicine

## 2017-12-12 ENCOUNTER — Other Ambulatory Visit: Payer: Self-pay | Admitting: Family Medicine

## 2017-12-12 ENCOUNTER — Other Ambulatory Visit: Payer: Self-pay

## 2017-12-12 VITALS — BP 138/70 | HR 94 | Temp 98.4°F | Wt 130.4 lb

## 2017-12-12 DIAGNOSIS — S0083XA Contusion of other part of head, initial encounter: Secondary | ICD-10-CM | POA: Diagnosis not present

## 2017-12-12 DIAGNOSIS — S52531A Colles' fracture of right radius, initial encounter for closed fracture: Secondary | ICD-10-CM | POA: Diagnosis not present

## 2017-12-12 DIAGNOSIS — G4700 Insomnia, unspecified: Secondary | ICD-10-CM

## 2017-12-12 DIAGNOSIS — S63501A Unspecified sprain of right wrist, initial encounter: Secondary | ICD-10-CM | POA: Diagnosis not present

## 2017-12-12 NOTE — Progress Notes (Signed)
   Subjective:    Patient ID: Amber Baird, female    DOB: March 05, 1935, 83 y.o.   MRN: [id number]  HPI Here with her daughter for injuries from a fall at home on 12-10-17. She stepped onto her kitchen hardwood floor with wet feet from her porch and slipped. She fell forward with her arms extended and she struck her face on the floor. No LOC. She has bruising around the nose and right eye. No blurred vision or headaches or nausea. Her main complaint is pain in the right wrist. She went to an urgent care clinic yesterday but they did not have Xray availability. She was given a splint which she is wearing today.    Review of Systems  Constitutional: Negative.   HENT: Positive for facial swelling.   Eyes: Negative.   Respiratory: Negative.   Cardiovascular: Negative.   Musculoskeletal: Positive for arthralgias.  Neurological: Negative.        Objective:   Physical Exam  Constitutional: She appears well-developed and well-nourished. No distress.  HENT:  She is tender over the bridge of the nose with ecchymosis, but no crepitus. She is tender around the right orbit with ecchymosis, but no crepitus. Teeth are intact   Eyes: Pupils are equal, round, and reactive to light. Conjunctivae and EOM are normal.  Neck: Normal range of motion. Neck supple. No thyromegaly present.  Cardiovascular: Normal rate, regular rhythm, normal heart sounds and intact distal pulses.  Pulmonary/Chest: Effort normal and breath sounds normal.  Musculoskeletal:  The right wrist is swollen. She is very tender over the distal radius. ROM is quite limited by pain.   Lymphadenopathy:    She has no cervical adenopathy.          Assessment & Plan:  She likely has a distal radius fracture. She has contusions around the face and nose. We will refer her to Orthopedics today to look at the wrist. She will wear the splint until they can see her.  Alysia Penna, MD

## 2017-12-13 DIAGNOSIS — M7918 Myalgia, other site: Secondary | ICD-10-CM | POA: Diagnosis not present

## 2017-12-13 DIAGNOSIS — M461 Sacroiliitis, not elsewhere classified: Secondary | ICD-10-CM | POA: Diagnosis not present

## 2018-01-10 DIAGNOSIS — M545 Low back pain: Secondary | ICD-10-CM | POA: Diagnosis not present

## 2018-01-10 DIAGNOSIS — M461 Sacroiliitis, not elsewhere classified: Secondary | ICD-10-CM | POA: Diagnosis not present

## 2018-01-10 DIAGNOSIS — M4125 Other idiopathic scoliosis, thoracolumbar region: Secondary | ICD-10-CM | POA: Diagnosis not present

## 2018-01-26 NOTE — Progress Notes (Signed)
Subjective:   Amber Baird is a 83 y.o. female who presents for Medicare Annual (Subsequent) preventive examination.   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 DEXA SCAN  09/06/2001   Will repeat dexa  Mammogram 04/2017 - has at [facility]        Objective:     Vitals: BP 126/60   Pulse 78   Ht 5' 1.5" (1.562 m)   Wt 127 lb (57.6 kg)   SpO2 95%   BMI 23.61 kg/m   Body mass index is 23.61 kg/m.  Advanced Directives 05/20/2016 05/20/2016 01/23/2016 01/13/2016  Does Patient Have a Medical Advance Directive? Yes Yes Yes Yes  Type of Advance Directive - - - [place] in Chart? No - copy requested No - copy requested - No - copy requested    Tobacco Social History   Tobacco Use  Smoking Status Never Smoker  Smokeless Tobacco Never Used     Counseling given: Yes   Clinical Intake:    Past Medical History:  Diagnosis Date  . Bullous pemphigoid   . Hypertension    Past Surgical History:  Procedure Laterality Date  . APPENDECTOMY  1980  . BREAST BIOPSY  1980  . VAGINAL HYSTERECTOMY  1980   Family History  Problem Relation Age of Onset  . Arthritis Unknown   . High Cholesterol Mother   . Heart disease Mother   . Hypertension Mother    Social History   Socioeconomic History  . Marital status: Widowed   Spouse name: Not on file  . Number of children: 1  . Years of education: Not on file  . Highest education level: Not on file  Occupational History  . Not on file  Social Needs  . Financial resource strain: Not on file  . Food insecurity:    Worry: Not on file    Inability: Not on file  . Transportation needs:    Medical: Not on file    Non-medical: Not on file  Tobacco Use  . Smoking status: Never Smoker  . Smokeless tobacco: Never Used  Substance and Sexual Activity  . Alcohol use: No    Alcohol/week: 0.0 oz  . Drug use: No  . Sexual activity: Not on file  Lifestyle  . Physical activity:    Days per week: Not on file    Minutes per session: Not on file  . Stress: Not on file  Relationships  . Social connections:    Talks on phone: Not on file    Gets together: Not on file    Attends religious service: Not on file    Active member of club or organization: Not on file    Attends meetings  of clubs or organizations: Not on file    Relationship status: Not on file  Other Topics Concern  . Not on file  Social History Narrative  . Not on file    Outpatient Encounter Medications as of 01/27/2018  Medication Sig  . atorvastatin (LIPITOR) 40 MG tablet TAKE 1 TABLET DAILY  . Biotin (BIOTIN 5000) 5 MG CAPS Take 1 capsule by mouth daily.  Marland Kitchen gabapentin (NEURONTIN) 300 MG capsule TAKE 1 CAPSULE(300 MG) BY MOUTH EVERY EVENING  . hydrochlorothiazide (HYDRODIURIL) 25 MG tablet TAKE 1 TABLET DAILY  . lisinopril (PRINIVIL,ZESTRIL) 30 MG tablet TAKE 1 TABLET(30 MG) BY MOUTH DAILY  . Magnesium 400 MG CAPS Take 400 mg by mouth daily.  . Multiple Vitamin (MULTIVITAMIN) tablet Take 1 tablet by mouth daily.  Marland Kitchen omeprazole (PRILOSEC) 20 MG capsule TAKE 1 CAPSULE DAILY BEFORE BREAKFAST  . spironolactone (ALDACTONE) 25 MG tablet TAKE 1 TABLET(25 MG) BY MOUTH DAILY   No facility-administered encounter medications on file as of 01/27/2018.     Activities of Daily Living No flowsheet data  found.  Patient Care Team: Martinique, Betty G, MD as PCP - General (Family Medicine)    Assessment:   This is a routine wellness examination for [place].  Exercise Activities and Dietary recommendations    Goals    . Patient Stated     Needing help to do small things in the yard Make a list of what you need done   [employer]; 503-739-9431 ( community housing solutions)  [employer]; 3171262379 Get resource to get information on any and all community programs for [place]; "Aging Gracefully In Place" program; can request or apply  [employer]: [phone] Community Health Response Program -[phone] Public Health Dept; Need to be a skilled visit but can assist with bathing as well; [phone]               Fall Risk Fall Risk  12/12/2017 05/20/2016 01/19/2016  Falls in the past year? Yes No No  Number falls in past yr: 1 - -  Injury with Fall? Yes - -     Depression Screen PHQ 2/9 Scores 08/31/2017 05/20/2016  PHQ - 2 Score 0 0     Cognitive Function MMSE - Mini Mental State Exam 05/20/2016  Not completed: (No Data)     Ad8 score reviewed for issues:  Issues making decisions:  Less interest in hobbies / activities:  Repeats questions, stories (family complaining):  Trouble using ordinary gadgets (microwave, computer, phone):  Forgets the month or year:   Mismanaging finances:   Remembering appts:  Daily problems with thinking and/or memory: Ad8 score is=0 Best friend is having some memory issues         Immunization History  Administered Date(s) Administered  . Influenza, High Dose Seasonal PF 05/20/2016  . Influenza-Unspecified 05/26/2017  . Pneumococcal Conjugate-13 08/02/2013  . Pneumococcal Polysaccharide-23 08/02/2008  . Tdap 10/07/2016  . Zoster 08/03/2003  . Zoster Recombinat (Shingrix) 12/09/2016, 03/21/2017    Screening Tests Health Maintenance  Topic Date Due  . DEXA SCAN  09/06/2001  .  INFLUENZA VACCINE  03/02/2018  . TETANUS/TDAP  10/08/2026  . PNA vac Low Risk Adult  Completed        Plan:      PCP Notes   Health Maintenance Will repeat dexa - not sure when she had one Agreed to have dexa for baseline   Mammogram 04/2017 - has at [place] had shingrix  Abnormal Screens  none  Referrals  none  Patient concerns;  a little more fatigue - will make an apt with Dr. Martinique  Nurse Concerns; As noted  Next PCP apt To be scheduled today    I have personally reviewed and noted the following in the patient's chart:   . Medical and social history . Use of alcohol, tobacco or illicit drugs  . Current medications and supplements . Functional ability and status . Nutritional status . Physical activity . Advanced directives . List of other physicians . Hospitalizations, surgeries, and ER visits in previous 12 months . Vitals . Screenings to include cognitive, depression, and falls . Referrals and appointments  In addition, I have reviewed and discussed with patient certain preventive protocols, quality metrics, and best practice recommendations. A written personalized care plan for preventive services as well as general preventive health recommendations were provided to patient.     Wynetta Fines, RN  01/27/2018

## 2018-01-27 ENCOUNTER — Ambulatory Visit (INDEPENDENT_AMBULATORY_CARE_PROVIDER_SITE_OTHER): Payer: Medicare Other

## 2018-01-27 VITALS — BP 126/60 | HR 78 | Ht 61.5 in | Wt 127.0 lb

## 2018-01-27 DIAGNOSIS — E2839 Other primary ovarian failure: Secondary | ICD-10-CM

## 2018-01-27 DIAGNOSIS — Z0000 Encounter for general adult medical examination without abnormal findings: Secondary | ICD-10-CM

## 2018-01-27 NOTE — Patient Instructions (Addendum)
Amber Baird , Thank you for taking time to come for your Medicare Wellness Visit. I appreciate your ongoing commitment to your health goals. Please review the following plan we discussed and let me know if I can assist you in the future.   Will make an apt with Dr. Martinique, May need B12 or other checked due to fatigue   Will schedule a dexa scan (bone density) at [facility]. Manuela Schwartz to fax them the order today     Recommendations for Dexa Scan Female over the age of 81 Man age 81 or older If you broke a bone past the age of 5 Women menopausal age with risk factors (thin frame; smoker; hx of fx ) Post menopausal women under the age of 10 with risk factors A man age 69 to 33 with risk factors Other: Spine xray that is showing break of bone loss Back pain with possible break Height loss of 1/2 inch or more within one year Total loss in height of 1.5 inches from your original height  Calcium 1228m with Vit D 800u per day; more as directed by physician Strength building exercises discussed; can include walking; housework; small weights or stretch bands; silver sneakers if [facility] to the [facility]  Please visit the osteoporosis [email or web address] for up to date recommendations    These are the goals we discussed: Goals    . Patient Stated     Needing help to do small things in the yard Make a list of what you need done   [employer] [phone]( community housing solutions)  Sr. [place] [phone] resource to get information on any and all community programs for [place] "Aging Gracefully In Place" program; can request or apply  [employer] [phone] Community Health Response Program -[phone] Health Dept; Need to be a skilled visit but can assist with bathing as well; [phone]           . Patient Stated     Will seek to volunteer to help children or adults to read        This is a list of the screening recommended for you and due  dates:  Health Maintenance  Topic Date Due  . DEXA scan (bone density measurement)  09/06/2001  . Flu Shot  03/02/2018  . Tetanus Vaccine  10/08/2026  . Pneumonia vaccines  Completed      Fat and Cholesterol Restricted Diet Getting too much fat and cholesterol in your diet may cause health problems. Following this diet helps keep your fat and cholesterol at normal levels. This can keep you from getting sick. What types of fat should I choose?  Choose monosaturated and polyunsaturated fats. These are found in foods such as olive oil, canola oil, flaxseeds, walnuts, almonds, and seeds.  Eat more omega-3 fats. Good choices include salmon, mackerel, sardines, tuna, flaxseed oil, and ground flaxseeds.  Limit saturated fats. These are in animal products such as meats, butter, and cream. They can also be in plant products such as palm oil, palm kernel oil, and coconut oil.  Avoid foods with partially hydrogenated oils in them. These contain trans fats. Examples of foods that have trans fats are stick margarine, some tub margarines, cookies, crackers, and other baked goods. What general guidelines do I need to follow?  Check food labels. Look for the words "trans fat" and "saturated fat."  When preparing a meal: ? Fill half of your plate with vegetables and green salads. ? Fill one fourth  of your plate with whole grains. Look for the word "whole" as the first word in the ingredient list. ? Fill one fourth of your plate with lean protein foods.  Eat more foods that have fiber, like apples, carrots, beans, peas, and barley.  Eat more home-cooked foods. Eat less at restaurants and buffets.  Limit or avoid alcohol.  Limit foods high in starch and sugar.  Limit fried foods.  Cook foods without frying them. Baking, boiling, grilling, and broiling are all great options.  Lose weight if you are overweight. Losing even a small amount of weight can help your overall health. It can also help  prevent diseases such as diabetes and heart disease. What foods can I eat? Grains Whole grains, such as whole wheat or whole grain breads, crackers, cereals, and pasta. Unsweetened oatmeal, bulgur, barley, quinoa, or brown rice. Corn or whole wheat flour tortillas. Vegetables Fresh or frozen vegetables (raw, steamed, roasted, or grilled). Green salads. Fruits All fresh, canned (in natural juice), or frozen fruits. Meat and Other Protein Products Ground beef (85% or leaner), grass-fed beef, or beef trimmed of fat. Skinless chicken or [place]. Ground chicken or [place]. Pork trimmed of fat. All fish and seafood. Eggs. Dried beans, peas, or lentils. Unsalted nuts or seeds. Unsalted canned or dry beans. Dairy Low-fat dairy products, such as skim or 1% milk, 2% or reduced-fat cheeses, low-fat ricotta or cottage cheese, or plain low-fat yogurt. Fats and Oils Tub margarines without trans fats. Light or reduced-fat mayonnaise and salad dressings. Avocado. Olive, canola, sesame, or safflower oils. Natural peanut or almond butter (choose ones without added sugar and oil). The items listed above may not be a complete list of recommended foods or beverages. Contact your dietitian for more options. What foods are not recommended? Grains White bread. White pasta. White rice. Cornbread. Bagels, pastries, and croissants. Crackers that contain trans fat. Vegetables White potatoes. Corn. Creamed or fried vegetables. Vegetables in a cheese sauce. Fruits Dried fruits. Canned fruit in light or heavy syrup. Fruit juice. Meat and Other Protein Products Fatty cuts of meat. Ribs, chicken wings, bacon, sausage, bologna, salami, chitterlings, fatback, hot dogs, bratwurst, and packaged luncheon meats. Liver and organ meats. Dairy Whole or 2% milk, cream, half-and-half, and cream cheese. Whole milk cheeses. Whole-fat or sweetened yogurt. Full-fat cheeses. Nondairy creamers and whipped toppings. Processed cheese, cheese  spreads, or cheese curds. Sweets and Desserts Corn syrup, sugars, honey, and molasses. Candy. Jam and jelly. Syrup. Sweetened cereals. Cookies, pies, cakes, donuts, muffins, and ice cream. Fats and Oils Butter, stick margarine, lard, shortening, ghee, or bacon fat. Coconut, palm kernel, or palm oils. Beverages Alcohol. Sweetened drinks (such as sodas, lemonade, and fruit drinks or punches). The items listed above may not be a complete list of foods and beverages to avoid. Contact your dietitian for more information. This information is not intended to replace advice given to you by your health care provider. Make sure you discuss any questions you have with your health care provider. Document Released: 01/18/2012 Document Revised: 03/25/2016 Document Reviewed: 10/18/2013 Elsevier Interactive Patient Education  2018 [place] Maintenance, Female Adopting a healthy lifestyle and getting preventive care can go a long way to promote health and wellness. Talk with your health care provider about what schedule of regular examinations is right for you. This is a good chance for you to check in with your provider about disease prevention and staying healthy. In between checkups, there are plenty of things you can do  on your own. Experts have done a lot of research about which lifestyle changes and preventive measures are most likely to keep you healthy. Ask your health care provider for more information. Weight and diet Eat a healthy diet  Be sure to include plenty of vegetables, fruits, low-fat dairy products, and lean protein.  Do not eat a lot of foods high in solid fats, added sugars, or salt.  Get regular exercise. This is one of the most important things you can do for your health. ? Most adults should exercise for at least 150 minutes each week. The exercise should increase your heart rate and make you sweat (moderate-intensity exercise). ? Most adults should also do strengthening  exercises at least twice a week. This is in addition to the moderate-intensity exercise.  Maintain a healthy weight  Body mass index (BMI) is a measurement that can be used to identify possible weight problems. It estimates body fat based on height and weight. Your health care provider can help determine your BMI and help you achieve or maintain a healthy weight.  For females 46 years of age and older: ? A BMI below 18.5 is considered underweight. ? A BMI of 18.5 to 24.9 is normal. ? A BMI of 25 to 29.9 is considered overweight. ? A BMI of 30 and above is considered obese.  Watch levels of cholesterol and blood lipids  You should start having your blood tested for lipids and cholesterol at 81 years of age, then have this test every 5 years.  You may need to have your cholesterol levels checked more often if: ? Your lipid or cholesterol levels are high. ? You are older than 81 years of age. ? You are at high risk for heart disease.  Cancer screening Lung Cancer  Lung cancer screening is recommended for adults 23-4 years old who are at high risk for lung cancer because of a history of smoking.  A yearly low-dose CT scan of the lungs is recommended for people who: ? Currently smoke. ? Have quit within the past 15 years. ? Have at least a 30-pack-year history of smoking. A pack year is smoking an average of one pack of cigarettes a day for 1 year.  Yearly screening should continue until it has been 15 years since you quit.  Yearly screening should stop if you develop a health problem that would prevent you from having lung cancer treatment.  Breast Cancer  Practice breast self-awareness. This means understanding how your breasts normally appear and feel.  It also means doing regular breast self-exams. Let your health care provider know about any changes, no matter how small.  If you are in your 20s or 30s, you should have a clinical breast exam (CBE) by a health care provider  every 1-3 years as part of a regular health exam.  If you are 85 or older, have a CBE every year. Also consider having a breast X-ray (mammogram) every year.  If you have a family history of breast cancer, talk to your health care provider about genetic screening.  If you are at high risk for breast cancer, talk to your health care provider about having an MRI and a mammogram every year.  Breast cancer gene (BRCA) assessment is recommended for women who have family members with BRCA-related cancers. BRCA-related cancers include: ? Breast. ? Ovarian. ? Tubal. ? Peritoneal cancers.  Results of the assessment will determine the need for genetic counseling and BRCA1 and BRCA2 testing.  Cervical  Cancer Your health care provider may recommend that you be screened regularly for cancer of the pelvic organs (ovaries, uterus, and vagina). This screening involves a pelvic examination, including checking for microscopic changes to the surface of your cervix (Pap test). You may be encouraged to have this screening done every 3 years, beginning at age 78.  For women ages 67-65, health care providers may recommend pelvic exams and Pap testing every 3 years, or they may recommend the Pap and pelvic exam, combined with testing for human papilloma virus (HPV), every 5 years. Some types of HPV increase your risk of cervical cancer. Testing for HPV may also be done on women of any age with unclear Pap test results.  Other health care providers may not recommend any screening for nonpregnant women who are considered low risk for pelvic cancer and who do not have symptoms. Ask your health care provider if a screening pelvic exam is right for you.  If you have had past treatment for cervical cancer or a condition that could lead to cancer, you need Pap tests and screening for cancer for at least 20 years after your treatment. If Pap tests have been discontinued, your risk factors (such as having a new sexual  partner) need to be reassessed to determine if screening should resume. Some women have medical problems that increase the chance of getting cervical cancer. In these cases, your health care provider may recommend more frequent screening and Pap tests.  Colorectal Cancer  This type of cancer can be detected and often prevented.  Routine colorectal cancer screening usually begins at 81 years of age and continues through 81 years of age.  Your health care provider may recommend screening at an earlier age if you have risk factors for colon cancer.  Your health care provider may also recommend using home test kits to check for hidden blood in the stool.  A small camera at the end of a tube can be used to examine your colon directly (sigmoidoscopy or colonoscopy). This is done to check for the earliest forms of colorectal cancer.  Routine screening usually begins at age 105.  Direct examination of the colon should be repeated every 5-10 years through 81 years of age. However, you may need to be screened more often if early forms of precancerous polyps or small growths are found.  Skin Cancer  Check your skin from head to toe regularly.  Tell your health care provider about any new moles or changes in moles, especially if there is a change in a mole's shape or color.  Also tell your health care provider if you have a mole that is larger than the size of a pencil eraser.  Always use sunscreen. Apply sunscreen liberally and repeatedly throughout the day.  Protect yourself by wearing long sleeves, pants, a wide-brimmed hat, and sunglasses whenever you are outside.  Heart disease, diabetes, and high blood pressure  High blood pressure causes heart disease and increases the risk of stroke. High blood pressure is more likely to develop in: ? People who have blood pressure in the high end of the normal range (130-139/85-89 mm Hg). ? People who are overweight or obese. ? People who are African  American.  If you are 70-55 years of age, have your blood pressure checked every 3-5 years. If you are 58 years of age or older, have your blood pressure checked every year. You should have your blood pressure measured twice-once when you are at a hospital or  clinic, and once when you are not at a hospital or clinic. Record the average of the two measurements. To check your blood pressure when you are not at a hospital or clinic, you can use: ? An automated blood pressure machine at a pharmacy. ? A home blood pressure monitor.  If you are between 22 years and 19 years old, ask your health care provider if you should take aspirin to prevent strokes.  Have regular diabetes screenings. This involves taking a blood sample to check your fasting blood sugar level. ? If you are at a normal weight and have a low risk for diabetes, have this test once every three years after 81 years of age. ? If you are overweight and have a high risk for diabetes, consider being tested at a younger age or more often. Preventing infection Hepatitis B  If you have a higher risk for hepatitis B, you should be screened for this virus. You are considered at high risk for hepatitis B if: ? You were born in a country where hepatitis B is common. Ask your health care provider which countries are considered high risk. ? Your parents were born in a high-risk country, and you have not been immunized against hepatitis B (hepatitis B vaccine). ? You have HIV or AIDS. ? You use needles to inject street drugs. ? You live with someone who has hepatitis B. ? You have had sex with someone who has hepatitis B. ? You get hemodialysis treatment. ? You take certain medicines for conditions, including cancer, organ transplantation, and autoimmune conditions.  Hepatitis C  Blood testing is recommended for: ? Everyone born from [birth year] through 1963. ? Anyone with known risk factors for hepatitis C.  Sexually transmitted infections  (STIs)  You should be screened for sexually transmitted infections (STIs) including gonorrhea and chlamydia if: ? You are sexually active and are younger than 81 years of age. ? You are older than 81 years of age and your health care provider tells you that you are at risk for this type of infection. ? Your sexual activity has changed since you were last screened and you are at an increased risk for chlamydia or gonorrhea. Ask your health care provider if you are at risk.  If you do not have HIV, but are at risk, it may be recommended that you take a prescription medicine daily to prevent HIV infection. This is called pre-exposure prophylaxis (PrEP). You are considered at risk if: ? You are sexually active and do not regularly use condoms or know the HIV status of your partner(s). ? You take drugs by injection. ? You are sexually active with a partner who has HIV.  Talk with your health care provider about whether you are at high risk of being infected with HIV. If you choose to begin PrEP, you should first be tested for HIV. You should then be tested every 3 months for as long as you are taking PrEP. Pregnancy  If you are premenopausal and you may become pregnant, ask your health care provider about preconception counseling.  If you may become pregnant, take 400 to 800 micrograms (mcg) of folic acid every day.  If you want to prevent pregnancy, talk to your health care provider about birth control (contraception). Osteoporosis and menopause  Osteoporosis is a disease in which the bones lose minerals and strength with aging. This can result in serious bone fractures. Your risk for osteoporosis can be identified using a bone density scan.  If you are 21 years of age or older, or if you are at risk for osteoporosis and fractures, ask your health care provider if you should be screened.  Ask your health care provider whether you should take a calcium or vitamin D supplement to lower your risk  for osteoporosis.  Menopause may have certain physical symptoms and risks.  Hormone replacement therapy may reduce some of these symptoms and risks. Talk to your health care provider about whether hormone replacement therapy is right for you. Follow these instructions at home:  Schedule regular health, dental, and eye exams.  Stay current with your immunizations.  Do not use any tobacco products including cigarettes, chewing tobacco, or electronic cigarettes.  If you are pregnant, do not drink alcohol.  If you are breastfeeding, limit how much and how often you drink alcohol.  Limit alcohol intake to no more than 1 drink per day for nonpregnant women. One drink equals 12 ounces of beer, 5 ounces of wine, or 1 ounces of hard liquor.  Do not use street drugs.  Do not share needles.  Ask your health care provider for help if you need support or information about quitting drugs.  Tell your health care provider if you often feel depressed.  Tell your health care provider if you have ever been abused or do not feel safe at home. This information is not intended to replace advice given to you by your health care provider. Make sure you discuss any questions you have with your health care provider. Document Released: 02/01/2011 Document Revised: 12/25/2015 Document Reviewed: 04/22/2015 Elsevier Interactive Patient Education  [employer].

## 2018-01-30 NOTE — Progress Notes (Signed)
I have reviewed documentation from this visit and I agree with recommendations given.  Jazz Rogala G. Rolene Andrades, MD  [facility]. [place] office.   

## 2018-02-07 ENCOUNTER — Other Ambulatory Visit: Payer: Self-pay | Admitting: Family Medicine

## 2018-02-27 DIAGNOSIS — M461 Sacroiliitis, not elsewhere classified: Secondary | ICD-10-CM | POA: Diagnosis not present

## 2018-02-27 DIAGNOSIS — M5136 Other intervertebral disc degeneration, lumbar region: Secondary | ICD-10-CM | POA: Diagnosis not present

## 2018-02-27 DIAGNOSIS — M545 Low back pain: Secondary | ICD-10-CM | POA: Diagnosis not present

## 2018-02-28 ENCOUNTER — Other Ambulatory Visit: Payer: Self-pay | Admitting: Family Medicine

## 2018-02-28 DIAGNOSIS — I10 Essential (primary) hypertension: Secondary | ICD-10-CM

## 2018-02-28 NOTE — Progress Notes (Signed)
HPI:   Amber Baird is a 83 y.o. female, who is here today for 6 months follow up.   She was last seen on 08/31/17.  Since her last visit she has follow with orthopedist, diagnosed with SI joint inflammation, which has greatly improved with treatment. She also fell on 12/10/17, she was seen on 12/12/17. According to pt, she followed with ortho and had no fractures.    Hypertension: Currently she is on HCTZ 25 mg daily, spironolactone 25 mg daily, and lisinopril 30 mg daily. Last eye exam:10/2017, improving abnormalities seeing prior visit, ?retinopathy.  Home BP readings: She is not checking BP at home but it has been normal when  Denies severe/frequent headache, visual changes, chest pain, dyspnea, palpitation, claudication, focal weakness, or edema.  Lab Results  Component Value Date   CREATININE 0.94 09/01/2017   BUN 18 09/01/2017   NA 136 09/01/2017   K 4.4 09/01/2017   CL 100 09/01/2017   CO2 30 09/01/2017    CKD III: Negative for decreased urine output or foam in urine.    Insomnia: Currently she is on gabapentin 300 mg daily. Still helping with sleep. Sleeping 6-7 hours.   Today she is also concerned about easy bruising on upper extremities, mainly forearms with no apparent history of trauma. She denies nosebleeding, gum bleeding, gross hematuria, blood in the stool, abnormal weight loss, fever, or night sweats. Lesions seems to resolve spontaneously.  She wonders if this is related with low magnesium. According to patient, she had similar problems in the past when magnesium was low. History of hypomagnesemia, currently she is on magnesium oxide 500 mg daily.    Review of Systems  Constitutional: Negative for activity change, appetite change, fatigue and fever.  HENT: Negative for mouth sores, nosebleeds and trouble swallowing.   Eyes: Negative for redness and visual disturbance.  Respiratory: Negative for cough, shortness of breath and  wheezing.   Cardiovascular: Negative for chest pain, palpitations and leg swelling.  Gastrointestinal: Negative for abdominal pain, blood in stool, nausea and vomiting.       Negative for changes in bowel habits.  Endocrine: Negative for cold intolerance and heat intolerance.  Genitourinary: Negative for decreased urine volume, dysuria and hematuria.  Musculoskeletal: Negative for gait problem and myalgias.  Skin: Negative for rash and wound.  Neurological: Negative for syncope, weakness and headaches.  Hematological: Negative for adenopathy. Bruises/bleeds easily.  Psychiatric/Behavioral: Negative for confusion and sleep disturbance.     Current Outpatient Medications on File Prior to Visit  Medication Sig Dispense Refill  . atorvastatin (LIPITOR) 40 MG tablet TAKE 1 TABLET DAILY 90 tablet 2  . Biotin (BIOTIN 5000) 5 MG CAPS Take 1 capsule by mouth daily.    . diclofenac sodium (VOLTAREN) 1 % GEL APPLY 2 TO 4 GRAMS AA BID PRN  2  . gabapentin (NEURONTIN) 300 MG capsule TAKE 1 CAPSULE(300 MG) BY MOUTH EVERY EVENING 90 capsule 0  . hydrochlorothiazide (HYDRODIURIL) 25 MG tablet TAKE 1 TABLET DAILY 90 tablet 1  . lisinopril (PRINIVIL,ZESTRIL) 30 MG tablet TAKE 1 TABLET(30 MG) BY MOUTH DAILY 90 tablet 3  . Magnesium 400 MG CAPS Take 400 mg by mouth daily.    . Multiple Vitamin (MULTIVITAMIN) tablet Take 1 tablet by mouth daily.    Marland Kitchen omeprazole (PRILOSEC) 20 MG capsule TAKE 1 CAPSULE DAILY BEFORE BREAKFAST 90 capsule 2  . spironolactone (ALDACTONE) 25 MG tablet TAKE 1 TABLET(25 MG) BY MOUTH DAILY 90 tablet 3  No current facility-administered medications on file prior to visit.      Past Medical History:  Diagnosis Date  . Bullous pemphigoid   . Hypertension    Allergies  Allergen Reactions  . Atenolol Other (See Comments)    Drops heart rate  . Hydralazine Hcl Swelling and Other (See Comments)    Swelling on face, arms red from elbow to fingers.  . Sulfa Antibiotics Other (See  Comments)    Childhood allergy   . Adhesive [Tape] Rash  . Amlodipine Rash and Other (See Comments)    flushing    Social History   Socioeconomic History  . Marital status: Widowed    Spouse name: Not on file  . Number of children: 1  . Years of education: Not on file  . Highest education level: Not on file  Occupational History  . Not on file  Social Needs  . Financial resource strain: Not on file  . Food insecurity:    Worry: Not on file    Inability: Not on file  . Transportation needs:    Medical: Not on file    Non-medical: Not on file  Tobacco Use  . Smoking status: Never Smoker  . Smokeless tobacco: Never Used  Substance and Sexual Activity  . Alcohol use: No    Alcohol/week: 0.0 oz  . Drug use: No  . Sexual activity: Not on file  Lifestyle  . Physical activity:    Days per week: Not on file    Minutes per session: Not on file  . Stress: Not on file  Relationships  . Social connections:    Talks on phone: Not on file    Gets together: Not on file    Attends religious service: Not on file    Active member of club or organization: Not on file    Attends meetings of clubs or organizations: Not on file    Relationship status: Not on file  Other Topics Concern  . Not on file  Social History Narrative  . Not on file    Vitals:   03/01/18 0847  BP: 122/64  Pulse: 76  Resp: 12  Temp: 97.7 F (36.5 C)  SpO2: 96%   Body mass index is 24.21 kg/m.  Physical Exam  Nursing note and vitals reviewed. Constitutional: She is oriented to person, place, and time. She appears well-developed and well-nourished. No distress.  HENT:  Head: Normocephalic and atraumatic.  Mouth/Throat: Oropharynx is clear and moist and mucous membranes are normal.  Eyes: Pupils are equal, round, and reactive to light. Conjunctivae are normal.  Cardiovascular: Normal rate and regular rhythm.  Murmur (soft SEM RUSB) heard. Pulses:      Dorsalis pedis pulses are 2+ on the right  side, and 2+ on the left side.  Respiratory: Effort normal and breath sounds normal. No respiratory distress.  GI: Soft. She exhibits no mass. There is no hepatomegaly. There is no tenderness.  Musculoskeletal: She exhibits no edema.  Lymphadenopathy:    She has no cervical adenopathy.  Neurological: She is alert and oriented to person, place, and time. She has normal strength. Coordination normal.  Skin: Skin is warm. Ecchymosis noted. No erythema.  A few scattered ecchymoses on distal aspect of arms and forearms. No tenderness or local edema.   Psychiatric: She has a normal mood and affect.  Well groomed, good eye contact.       ASSESSMENT AND PLAN:   Amber Baird was seen today for  6 months follow-up.  Orders Placed This Encounter  Procedures  . Basic metabolic panel  . CBC  . Magnesium   Lab Results  Component Value Date   WBC 8.6 03/01/2018   HGB 13.3 03/01/2018   HCT 38.4 03/01/2018   MCV 94.7 03/01/2018   PLT 317.0 03/01/2018   Lab Results  Component Value Date   CREATININE 1.15 03/01/2018   BUN 21 03/01/2018   NA 137 03/01/2018   K 3.9 03/01/2018   CL 100 03/01/2018   CO2 30 03/01/2018    Bruising, spontaneous  Possible causes discussed. Most likely related to senile thrombocytopenia. Instructed about warning signs.   Essential hypertension Adequately controlled. Continue low-salt diet. Eye exam is current. No changes in current management. Follow-up in 6 months.  Insomnia Problem is well controlled with gabapentin 300 mg at bedtime. Good sleep hygiene. Follow-up in 6 months.  Senile ecchymosis Reassured. We discussed other possible causes of easy bruising. Further recommendation will be given according to CBC results.  Hypomagnesemia No changes in current management, will follow labs done today and will give further recommendations accordingly.   CKD (chronic kidney disease), stage III Problem has been stable. Continue  low-salt diet. Avoid NSAIDs. Adequate hydration. She will continue lisinopril 30 mg. Follow-up in 6 months.     Betty G. Martinique, MD  [facility]. [place] office.

## 2018-03-01 ENCOUNTER — Encounter: Payer: Self-pay | Admitting: Family Medicine

## 2018-03-01 ENCOUNTER — Ambulatory Visit (INDEPENDENT_AMBULATORY_CARE_PROVIDER_SITE_OTHER): Payer: Medicare Other | Admitting: Family Medicine

## 2018-03-01 VITALS — BP 122/64 | HR 76 | Temp 97.7°F | Resp 12 | Ht 61.5 in | Wt 130.2 lb

## 2018-03-01 DIAGNOSIS — R233 Spontaneous ecchymoses: Secondary | ICD-10-CM

## 2018-03-01 DIAGNOSIS — N183 Chronic kidney disease, stage 3 unspecified: Secondary | ICD-10-CM

## 2018-03-01 DIAGNOSIS — G4700 Insomnia, unspecified: Secondary | ICD-10-CM | POA: Diagnosis not present

## 2018-03-01 DIAGNOSIS — I10 Essential (primary) hypertension: Secondary | ICD-10-CM | POA: Diagnosis not present

## 2018-03-01 LAB — BASIC METABOLIC PANEL
BUN: 21 mg/dL (ref 6–23)
CO2: 30 meq/L (ref 19–32)
Calcium: 9.6 mg/dL (ref 8.4–10.5)
Chloride: 100 mEq/L (ref 96–112)
Creatinine, Ser: 1.15 mg/dL (ref 0.40–1.20)
GFR: 48.08 mL/min — ABNORMAL LOW (ref >60.00)
GLUCOSE: 107 mg/dL — AB (ref 70–99)
POTASSIUM: 3.9 meq/L (ref 3.5–5.1)
SODIUM: 137 meq/L (ref 135–145)

## 2018-03-01 LAB — CBC
HEMATOCRIT: 38.4 % (ref 36.0–46.0)
HEMOGLOBIN: 13.3 g/dL (ref 12.0–15.0)
MCHC: 34.7 g/dL (ref 30.0–36.0)
MCV: 94.7 fl (ref 78.0–100.0)
PLATELETS: 317 10*3/uL (ref 150.0–400.0)
RBC: 4.05 Mil/uL (ref 3.87–5.11)
RDW: 13.1 % (ref 11.5–15.5)
WBC: 8.6 10*3/uL (ref 4.0–10.5)

## 2018-03-01 LAB — MAGNESIUM: Magnesium: 2.2 mg/dL (ref 1.5–2.5)

## 2018-03-01 NOTE — Assessment & Plan Note (Signed)
Problem has been stable. Continue low-salt diet. Avoid NSAIDs. Adequate hydration. She will continue lisinopril 30 mg. Follow-up in 6 months.

## 2018-03-01 NOTE — Assessment & Plan Note (Signed)
No changes in current management, will follow labs done today and will give further recommendations accordingly.  

## 2018-03-01 NOTE — Patient Instructions (Signed)
A few things to remember from today's visit:   CKD (chronic kidney disease), stage III ([place])  Essential hypertension - Plan: Basic metabolic panel  Insomnia, unspecified type - Plan: CBC  Bruising, spontaneous  Hypomagnesemia - Plan: Magnesium     Please be sure medication list is accurate. If a new problem present, please set up appointment sooner than planned today.

## 2018-03-01 NOTE — Assessment & Plan Note (Signed)
Adequately controlled. Continue low-salt diet. Eye exam is current. No changes in current management. Follow-up in 6 months.

## 2018-03-01 NOTE — Assessment & Plan Note (Signed)
Problem is well controlled with gabapentin 300 mg at bedtime. Good sleep hygiene. Follow-up in 6 months.

## 2018-03-01 NOTE — Assessment & Plan Note (Signed)
Reassured. We discussed other possible causes of easy bruising. Further recommendation will be given according to CBC results.

## 2018-03-04 ENCOUNTER — Encounter: Payer: Self-pay | Admitting: Family Medicine

## 2018-03-14 ENCOUNTER — Other Ambulatory Visit: Payer: Self-pay | Admitting: Family Medicine

## 2018-03-14 DIAGNOSIS — G4700 Insomnia, unspecified: Secondary | ICD-10-CM

## 2018-04-08 ENCOUNTER — Other Ambulatory Visit: Payer: Self-pay | Admitting: Family Medicine

## 2018-04-08 DIAGNOSIS — E782 Mixed hyperlipidemia: Secondary | ICD-10-CM

## 2018-05-09 ENCOUNTER — Other Ambulatory Visit: Payer: Self-pay | Admitting: Family Medicine

## 2018-05-09 DIAGNOSIS — I10 Essential (primary) hypertension: Secondary | ICD-10-CM

## 2018-06-11 ENCOUNTER — Other Ambulatory Visit: Payer: Self-pay | Admitting: Family Medicine

## 2018-06-11 DIAGNOSIS — K219 Gastro-esophageal reflux disease without esophagitis: Secondary | ICD-10-CM

## 2018-06-14 ENCOUNTER — Other Ambulatory Visit: Payer: Self-pay | Admitting: Family Medicine

## 2018-06-14 DIAGNOSIS — G4700 Insomnia, unspecified: Secondary | ICD-10-CM

## 2018-06-16 ENCOUNTER — Other Ambulatory Visit: Payer: Self-pay | Admitting: Family Medicine

## 2018-06-16 DIAGNOSIS — I10 Essential (primary) hypertension: Secondary | ICD-10-CM

## 2018-08-03 ENCOUNTER — Other Ambulatory Visit: Payer: Self-pay | Admitting: Rehabilitation

## 2018-08-03 DIAGNOSIS — M4125 Other idiopathic scoliosis, thoracolumbar region: Secondary | ICD-10-CM | POA: Diagnosis not present

## 2018-08-03 DIAGNOSIS — M461 Sacroiliitis, not elsewhere classified: Secondary | ICD-10-CM

## 2018-08-03 DIAGNOSIS — M545 Low back pain: Secondary | ICD-10-CM | POA: Diagnosis not present

## 2018-08-15 ENCOUNTER — Ambulatory Visit
Admission: RE | Admit: 2018-08-15 | Discharge: 2018-08-15 | Disposition: A | Discharge status: Home / Self Care | Payer: Medicare Other | Source: Ambulatory Visit | Attending: Rehabilitation | Admitting: Rehabilitation

## 2018-08-15 DIAGNOSIS — M5127 Other intervertebral disc displacement, lumbosacral region: Secondary | ICD-10-CM | POA: Diagnosis not present

## 2018-08-15 DIAGNOSIS — M461 Sacroiliitis, not elsewhere classified: Secondary | ICD-10-CM

## 2018-08-15 DIAGNOSIS — M47816 Spondylosis without myelopathy or radiculopathy, lumbar region: Secondary | ICD-10-CM | POA: Diagnosis not present

## 2018-08-15 DIAGNOSIS — M5126 Other intervertebral disc displacement, lumbar region: Secondary | ICD-10-CM | POA: Diagnosis not present

## 2018-08-15 DIAGNOSIS — M48061 Spinal stenosis, lumbar region without neurogenic claudication: Secondary | ICD-10-CM | POA: Diagnosis not present

## 2018-08-22 DIAGNOSIS — M5136 Other intervertebral disc degeneration, lumbar region: Secondary | ICD-10-CM | POA: Diagnosis not present

## 2018-08-25 ENCOUNTER — Other Ambulatory Visit: Payer: Self-pay | Admitting: Family Medicine

## 2018-08-25 DIAGNOSIS — G4700 Insomnia, unspecified: Secondary | ICD-10-CM

## 2018-09-04 ENCOUNTER — Encounter: Payer: Self-pay | Admitting: Family Medicine

## 2018-09-04 ENCOUNTER — Ambulatory Visit (INDEPENDENT_AMBULATORY_CARE_PROVIDER_SITE_OTHER): Payer: Medicare Other | Admitting: Family Medicine

## 2018-09-04 ENCOUNTER — Other Ambulatory Visit: Payer: Self-pay

## 2018-09-04 VITALS — BP 120/74 | HR 60 | Temp 97.6°F | Resp 12 | Ht 62.0 in | Wt 129.2 lb

## 2018-09-04 DIAGNOSIS — I10 Essential (primary) hypertension: Secondary | ICD-10-CM

## 2018-09-04 DIAGNOSIS — K219 Gastro-esophageal reflux disease without esophagitis: Secondary | ICD-10-CM

## 2018-09-04 DIAGNOSIS — G4700 Insomnia, unspecified: Secondary | ICD-10-CM | POA: Diagnosis not present

## 2018-09-04 DIAGNOSIS — E7800 Pure hypercholesterolemia, unspecified: Secondary | ICD-10-CM | POA: Diagnosis not present

## 2018-09-04 DIAGNOSIS — N183 Chronic kidney disease, stage 3 unspecified: Secondary | ICD-10-CM

## 2018-09-04 LAB — LIPID PANEL
CHOL/HDL RATIO: 3
Cholesterol: 173 mg/dL (ref 0–200)
HDL: 57.9 mg/dL (ref >39.00)
LDL Cholesterol: 87 mg/dL (ref 0–99)
NonHDL: 115.33
TRIGLYCERIDES: 140 mg/dL (ref 0.0–149.0)
VLDL: 28 mg/dL (ref 0.0–40.0)

## 2018-09-04 LAB — COMPREHENSIVE METABOLIC PANEL
ALT: 22 U/L (ref 0–35)
AST: 18 U/L (ref 0–37)
Albumin: 4.4 g/dL (ref 3.5–5.2)
Alkaline Phosphatase: 72 U/L (ref 39–117)
BUN: 25 mg/dL — ABNORMAL HIGH (ref 6–23)
CO2: 27 mEq/L (ref 19–32)
CREATININE: 1.26 mg/dL — AB (ref 0.40–1.20)
Calcium: 10.4 mg/dL (ref 8.4–10.5)
Chloride: 94 mEq/L — ABNORMAL LOW (ref 96–112)
GFR: 40.66 mL/min — ABNORMAL LOW (ref >60.00)
Glucose, Bld: 90 mg/dL (ref 70–99)
Potassium: 4.5 mEq/L (ref 3.5–5.1)
Sodium: 130 mEq/L — ABNORMAL LOW (ref 135–145)
Total Bilirubin: 0.7 mg/dL (ref 0.2–1.2)
Total Protein: 6.7 g/dL (ref 6.0–8.3)

## 2018-09-04 LAB — MAGNESIUM: Magnesium: 1.7 mg/dL (ref 1.5–2.5)

## 2018-09-04 MED ORDER — LISINOPRIL 30 MG PO TABS: 30.0000 mg | ORAL_TABLET | Freq: Every day | ORAL | 2 refills | Status: DC

## 2018-09-04 MED ORDER — OMEPRAZOLE 20 MG PO CPDR: 20.0000 mg | DELAYED_RELEASE_CAPSULE | Freq: Every day | ORAL | 3 refills | Status: DC

## 2018-09-04 NOTE — Patient Instructions (Signed)
A few things to remember from today's visit:   Essential hypertension - Plan: lisinopril (PRINIVIL,ZESTRIL) 30 MG tablet, Comprehensive metabolic panel  Gastroesophageal reflux disease, esophagitis presence not specified - Plan: omeprazole (PRILOSEC) 20 MG capsule  Insomnia, unspecified type  Hypercholesteremia - Plan: Comprehensive metabolic panel, Lipid panel  CKD (chronic kidney disease), stage III ([facility])  Hypomagnesemia - Plan: Magnesium  Arrange appointment with back doctor.   Please be sure medication list is accurate. If a new problem present, please set up appointment sooner than planned today.

## 2018-09-04 NOTE — Progress Notes (Signed)
HPI:   Amber Baird is a 84 y.o. female, who is here today for 6 months follow up.   She was last seen on 03/01/18.  Since her last OV she has followed with ortho due to lower back. She is c/o lower back pain getting worse. She received a "cortisone shot",which really helped,having pain again for the past 2 weeks. Pain is constant,radiated to upper back. It is exacerbated by prolonged standing,walking,and with getting up after prolonged sitting. No recent injury or unusual activity.  She has not noted saddle anesthesia.  She takes Gabapentin 300 mg at bedtime. Medication was initially stated to treat post herpetic neuropathy,she continued because it has helped her with sleep. She sleeps well. No side effects from medication.    Hyperlipidemia: Currently on Atorvastatin 40 mg daily. Following a low fat diet: Yes.. She has not noted side effects with medication.  Lab Results  Component Value Date   CHOL 153 09/01/2017   HDL 58.50 09/01/2017   LDLCALC 79 09/01/2017   TRIG 82.0 09/01/2017   CHOLHDL 3 09/01/2017   HTN, she is on Lisinopril 30 mg,HCTZ 25 mg,spironolactone 25 mg daily. BP readings at home < 140/90,occasionally she has a SBP in the low 140's. Denies severe/frequent headache, visual changes, chest pain, dyspnea, palpitation, claudication, focal weakness, or edema.  Lab Results  Component Value Date   CREATININE 1.15 03/01/2018   BUN 21 03/01/2018   NA 137 03/01/2018   K 3.9 03/01/2018   CL 100 03/01/2018   CO2 30 03/01/2018   She has also had some episodes of hypoNa++, usually treated with fluid restriction.   HypoMg,she is on Mg Oxide 400 mg daily, higher doses have caused diarrhea. She stopped taking Mg supplementation for a few days and noted left arm "jumping", resolved after resuming Mg oxide. States that she knows this was related to low Mg because it has happened before when she was first Dx with low Mg.  CKD,she denies gross  hematuria,decreased urine output,or foam in urine.  GERD: She is on Omeprazole 20 mg daily. She has heartburn and epigastric burning sensation if she skipped medication for 2 days.  Denies abdominal pain, nausea, vomiting, changes in bowel habits, blood in stool or melena.    Review of Systems  Constitutional: Negative for activity change, appetite change, fatigue and fever.  HENT: Negative for mouth sores and nosebleeds.   Eyes: Negative for redness and visual disturbance.  Respiratory: Negative for cough, shortness of breath and wheezing.   Cardiovascular: Negative for chest pain, palpitations and leg swelling.  Gastrointestinal: Negative for abdominal pain, nausea and vomiting.       Negative for changes in bowel habits.  Genitourinary: Negative for decreased urine volume, dysuria and hematuria.  Musculoskeletal: Positive for back pain and gait problem.  Skin: Negative for rash and wound.  Neurological: Negative for syncope, weakness and headaches.  Psychiatric/Behavioral: Positive for sleep disturbance. The patient is nervous/anxious.     Current Outpatient Medications on File Prior to Visit  Medication Sig Dispense Refill  . atorvastatin (LIPITOR) 40 MG tablet TAKE 1 TABLET DAILY 90 tablet 4  . Biotin (BIOTIN 5000) 5 MG CAPS Take 1 capsule by mouth daily.    Marland Kitchen gabapentin (NEURONTIN) 300 MG capsule TAKE 1 CAPSULE(300 MG) BY MOUTH EVERY EVENING 90 capsule 0  . hydrochlorothiazide (HYDRODIURIL) 25 MG tablet TAKE 1 TABLET DAILY 90 tablet 4  . Magnesium 400 MG CAPS Take 400 mg by mouth daily.    Marland Kitchen  Multiple Vitamin (MULTIVITAMIN) tablet Take 1 tablet by mouth daily.    Marland Kitchen spironolactone (ALDACTONE) 25 MG tablet TAKE 1 TABLET(25 MG) BY MOUTH DAILY 90 tablet 3  . diclofenac sodium (VOLTAREN) 1 % GEL APPLY 2 TO 4 GRAMS AA BID PRN  2   No current facility-administered medications on file prior to visit.      Past Medical History:  Diagnosis Date  . Bullous pemphigoid   .  Hypertension    Allergies  Allergen Reactions  . Carvedilol Diarrhea  . Atenolol Other (See Comments)    Drops heart rate  . Hydralazine Hcl Swelling and Other (See Comments)    Swelling on face, arms red from elbow to fingers.  . Sulfa Antibiotics Other (See Comments)    Childhood allergy   . Adhesive [Tape] Rash  . Amlodipine Rash and Other (See Comments)    flushing    Social History   Socioeconomic History  . Marital status: Widowed    Spouse name: Not on file  . Number of children: 1  . Years of education: Not on file  . Highest education level: Not on file  Occupational History  . Not on file  Social Needs  . Financial resource strain: Not on file  . Food insecurity:    Worry: Not on file    Inability: Not on file  . Transportation needs:    Medical: Not on file    Non-medical: Not on file  Tobacco Use  . Smoking status: Never Smoker  . Smokeless tobacco: Never Used  Substance and Sexual Activity  . Alcohol use: No    Alcohol/week: 0.0 standard drinks  . Drug use: No  . Sexual activity: Not on file  Lifestyle  . Physical activity:    Days per week: Not on file    Minutes per session: Not on file  . Stress: Not on file  Relationships  . Social connections:    Talks on phone: Not on file    Gets together: Not on file    Attends religious service: Not on file    Active member of club or organization: Not on file    Attends meetings of clubs or organizations: Not on file    Relationship status: Not on file  Other Topics Concern  . Not on file  Social History Narrative  . Not on file    Vitals:   09/04/18 0901  BP: 120/74  Pulse: 60  Resp: 12  Temp: 97.6 F (36.4 C)   Body mass index is 24.02 kg/m.   Physical Exam  Nursing note and vitals reviewed. Constitutional: She is oriented to person, place, and time. She appears well-developed and well-nourished. No distress.  HENT:  Head: Normocephalic and atraumatic.  Mouth/Throat: Oropharynx is  clear and moist and mucous membranes are normal.  Eyes: Pupils are equal, round, and reactive to light. Conjunctivae are normal.  Cardiovascular: Normal rate and regular rhythm.  Murmur (SEM I/VI RUSB) heard. Pulses:      Dorsalis pedis pulses are 2+ on the right side and 2+ on the left side.  Respiratory: Effort normal and breath sounds normal. No respiratory distress.  GI: Soft. She exhibits no mass. There is no hepatomegaly. There is no abdominal tenderness.  Musculoskeletal:        General: No edema.     Thoracic back: She exhibits deformity (kyphosis). She exhibits no tenderness and no bony tenderness.     Lumbar back: She exhibits deformity (Scoliosis). She exhibits  no tenderness and no bony tenderness.  Lymphadenopathy:    She has no cervical adenopathy.  Neurological: She is alert and oriented to person, place, and time. She has normal strength. No cranial nerve deficit.  Otherwise stable gait with no assistance.  Skin: Skin is warm. No rash noted. No erythema.  Psychiatric: She has a normal mood and affect.  Well groomed, good eye contact.     ASSESSMENT AND PLAN:   Ms. Neil Errickson was seen today for 6 months follow-up.  Orders Placed This Encounter  Procedures  . Comprehensive metabolic panel  . Lipid panel  . Magnesium   Lab Results  Component Value Date   CHOL 173 09/04/2018   HDL 57.90 09/04/2018   LDLCALC 87 09/04/2018   TRIG 140.0 09/04/2018   CHOLHDL 3 09/04/2018   Lab Results  Component Value Date   ALT 22 09/04/2018   AST 18 09/04/2018   ALKPHOS 72 09/04/2018   BILITOT 0.7 09/04/2018   Lab Results  Component Value Date   CREATININE 1.26 (H) 09/04/2018   BUN 25 (H) 09/04/2018   NA 130 (L) 09/04/2018   K 4.5 09/04/2018   CL 94 (L) 09/04/2018   CO2 27 09/04/2018     Essential hypertension Adequately controlled. No changes in current management. Continue low salt diet. Eye exam current.  F/U in 6 months, before if needed.  -      lisinopril (PRINIVIL,ZESTRIL) 30 MG tablet; Take 1 tablet (30 mg total) by mouth daily. -     Comprehensive metabolic panel  Gastroesophageal reflux disease, esophagitis presence not specified Well controlled. Continue GERD precautions. No changes in Omeprazole. F/U in 12 months,before if needed.  -     omeprazole (PRILOSEC) 20 MG capsule; Take 1 capsule (20 mg total) by mouth daily.  Insomnia, unspecified type Good sleep hygiene recommended. No changes in Gabapentin 300 mg.  Hypercholesteremia No changes in current management, will follow labs done today and will give further recommendations accordingly.  -     Comprehensive metabolic panel -     Lipid panel  CKD (chronic kidney disease), stage III ([facility]) Continue Lisinopril,l salt diet,and adequate hydration. Avoid NSAID's. Adequate BP controlled. Cr has been 1-1.26  and e GFR in the mid 40's.  Hypomagnesemia Continue Mg Oxide 400 mg daily. Further recommendations will be given according to Mg levels.  -     Magnesium   In regard to lower back pain,recommend arranging appt with ortho. Fall precautions discussed.    Return in about 6 months (around 03/05/2019) for F/U and Medicare.      G. Martinique, MD  [facility]. [place] office.

## 2018-09-06 DIAGNOSIS — M4125 Other idiopathic scoliosis, thoracolumbar region: Secondary | ICD-10-CM | POA: Diagnosis not present

## 2018-09-06 DIAGNOSIS — M5136 Other intervertebral disc degeneration, lumbar region: Secondary | ICD-10-CM | POA: Diagnosis not present

## 2018-09-06 DIAGNOSIS — M545 Low back pain: Secondary | ICD-10-CM | POA: Diagnosis not present

## 2018-09-07 ENCOUNTER — Encounter: Payer: Self-pay | Admitting: Family Medicine

## 2018-10-03 DIAGNOSIS — L649 Androgenic alopecia, unspecified: Secondary | ICD-10-CM | POA: Diagnosis not present

## 2018-11-20 ENCOUNTER — Other Ambulatory Visit: Payer: Self-pay | Admitting: *Deleted

## 2018-11-20 DIAGNOSIS — G4700 Insomnia, unspecified: Secondary | ICD-10-CM

## 2018-11-20 MED ORDER — GABAPENTIN 300 MG PO CAPS: ORAL_CAPSULE | ORAL | 0 refills | Status: DC

## 2018-12-19 DIAGNOSIS — H35033 Hypertensive retinopathy, bilateral: Secondary | ICD-10-CM | POA: Diagnosis not present

## 2018-12-19 DIAGNOSIS — H35372 Puckering of macula, left eye: Secondary | ICD-10-CM | POA: Diagnosis not present

## 2018-12-19 DIAGNOSIS — H35013 Changes in retinal vascular appearance, bilateral: Secondary | ICD-10-CM | POA: Diagnosis not present

## 2018-12-19 DIAGNOSIS — H43813 Vitreous degeneration, bilateral: Secondary | ICD-10-CM | POA: Diagnosis not present

## 2019-01-29 ENCOUNTER — Ambulatory Visit: Payer: Medicare Other

## 2019-02-08 ENCOUNTER — Ambulatory Visit: Payer: Medicare Other

## 2019-02-08 DIAGNOSIS — C44329 Squamous cell carcinoma of skin of other parts of face: Secondary | ICD-10-CM | POA: Diagnosis not present

## 2019-02-08 DIAGNOSIS — D485 Neoplasm of uncertain behavior of skin: Secondary | ICD-10-CM | POA: Diagnosis not present

## 2019-02-08 DIAGNOSIS — L570 Actinic keratosis: Secondary | ICD-10-CM | POA: Diagnosis not present

## 2019-02-09 ENCOUNTER — Other Ambulatory Visit: Payer: Self-pay | Admitting: Family Medicine

## 2019-02-11 ENCOUNTER — Other Ambulatory Visit: Payer: Self-pay | Admitting: Family Medicine

## 2019-02-11 DIAGNOSIS — G4700 Insomnia, unspecified: Secondary | ICD-10-CM

## 2019-02-21 DIAGNOSIS — Z85828 Personal history of other malignant neoplasm of skin: Secondary | ICD-10-CM | POA: Diagnosis not present

## 2019-02-21 DIAGNOSIS — C44329 Squamous cell carcinoma of skin of other parts of face: Secondary | ICD-10-CM | POA: Diagnosis not present

## 2019-02-26 ENCOUNTER — Other Ambulatory Visit: Payer: Self-pay | Admitting: Family Medicine

## 2019-02-26 DIAGNOSIS — I10 Essential (primary) hypertension: Secondary | ICD-10-CM

## 2019-03-01 DIAGNOSIS — M4125 Other idiopathic scoliosis, thoracolumbar region: Secondary | ICD-10-CM | POA: Diagnosis not present

## 2019-03-01 DIAGNOSIS — M5136 Other intervertebral disc degeneration, lumbar region: Secondary | ICD-10-CM | POA: Diagnosis not present

## 2019-03-06 DIAGNOSIS — M5136 Other intervertebral disc degeneration, lumbar region: Secondary | ICD-10-CM | POA: Diagnosis not present

## 2019-03-12 ENCOUNTER — Other Ambulatory Visit: Payer: Self-pay

## 2019-03-12 ENCOUNTER — Ambulatory Visit (INDEPENDENT_AMBULATORY_CARE_PROVIDER_SITE_OTHER): Payer: Medicare Other | Admitting: Family Medicine

## 2019-03-12 ENCOUNTER — Encounter: Payer: Self-pay | Admitting: Family Medicine

## 2019-03-12 VITALS — BP 116/60 | HR 79 | Temp 97.7°F | Resp 12 | Ht 62.0 in | Wt 131.0 lb

## 2019-03-12 DIAGNOSIS — N183 Chronic kidney disease, stage 3 unspecified: Secondary | ICD-10-CM

## 2019-03-12 DIAGNOSIS — I10 Essential (primary) hypertension: Secondary | ICD-10-CM

## 2019-03-12 DIAGNOSIS — L989 Disorder of the skin and subcutaneous tissue, unspecified: Secondary | ICD-10-CM | POA: Diagnosis not present

## 2019-03-12 DIAGNOSIS — E871 Hypo-osmolality and hyponatremia: Secondary | ICD-10-CM

## 2019-03-12 LAB — BASIC METABOLIC PANEL
BUN: 25 mg/dL — ABNORMAL HIGH (ref 6–23)
CO2: 25 mEq/L (ref 19–32)
Calcium: 9.9 mg/dL (ref 8.4–10.5)
Chloride: 98 mEq/L (ref 96–112)
Creatinine, Ser: 1.23 mg/dL — ABNORMAL HIGH (ref 0.40–1.20)
GFR: 41.75 mL/min — ABNORMAL LOW (ref >60.00)
Glucose, Bld: 84 mg/dL (ref 70–99)
Potassium: 4.5 mEq/L (ref 3.5–5.1)
Sodium: 134 mEq/L — ABNORMAL LOW (ref 135–145)

## 2019-03-12 LAB — MAGNESIUM: Magnesium: 1.8 mg/dL (ref 1.5–2.5)

## 2019-03-12 LAB — MICROALBUMIN / CREATININE URINE RATIO
Creatinine,U: 42.8 mg/dL
Microalb Creat Ratio: 1.6 mg/g (ref 0.0–30.0)
Microalb, Ur: <0.7 mg/dL (ref 0.0–1.9)

## 2019-03-12 LAB — CBC
HCT: 38.9 % (ref 36.0–46.0)
Hemoglobin: 13.3 g/dL (ref 12.0–15.0)
MCHC: 34.1 g/dL (ref 30.0–36.0)
MCV: 94.2 fl (ref 78.0–100.0)
Platelets: 325 10*3/uL (ref 150.0–400.0)
RBC: 4.13 Mil/uL (ref 3.87–5.11)
RDW: 12.7 % (ref 11.5–15.5)
WBC: 9 10*3/uL (ref 4.0–10.5)

## 2019-03-12 LAB — VITAMIN D 25 HYDROXY (VIT D DEFICIENCY, FRACTURES): VITD: 49.92 ng/mL (ref 30.00–100.00)

## 2019-03-12 NOTE — Patient Instructions (Signed)
A few things to remember from today's visit:   CKD (chronic kidney disease), stage III ([place]) - Plan: Basic metabolic panel, Microalbumin / creatinine urine ratio, VITAMIN D 25 Hydroxy (Vit-D Deficiency, Fractures), CBC  Essential hypertension - Plan: Basic metabolic panel  Hypomagnesemia - Plan: Magnesium   Please be sure medication list is accurate. If a new problem present, please set up appointment sooner than planned today.

## 2019-03-12 NOTE — Assessment & Plan Note (Signed)
Problem has been stable. Continue lisinopril 40 mg. Low-salt diet and avoidance of NSAIDs.

## 2019-03-12 NOTE — Progress Notes (Signed)
HPI:   Amber Baird is a 84 y.o. female, who is here today for chronic disease management. No new problems since her last visit. She was last seen on 09/04/18.  HTN,she is not checking BP at home because it causes anxiety. BP 127/79 during recent ortho f/u. Denies severe/frequent headache, visual changes, chest pain, dyspnea, palpitation, claudication, focal weakness, or edema.  CKD III,she has not noted gross hematuria,foam in urine,or decreased urine output.  HypoNa++, has been stable for a couple months.  Lab Results  Component Value Date   CREATININE 1.26 (H) 09/04/2018   BUN 25 (H) 09/04/2018   NA 130 (L) 09/04/2018   K 4.5 09/04/2018   CL 94 (L) 09/04/2018   CO2 27 09/04/2018    Since her last visit she has seen neurosurgeon, she has received 2 epidural injections. The 2nd one did not help much.   She would like Mg check. Hx of hypoMg++,she is on Mg oxide 250 mg daily. Higher doses caused diarrhea.  Today she is concerned about lesion right side of nose, noted last month. She is not sure about growth. No Hx of trauma. Lesion does not hurt or itch. No bleeding. She would like dermatology referral.  Review of Systems  Constitutional: Negative for activity change, appetite change, fatigue and fever.  HENT: Negative for mouth sores, nosebleeds, sinus pain and sore throat.   Eyes: Negative for pain and redness.  Respiratory: Negative for cough and wheezing.   Gastrointestinal: Negative for abdominal pain, nausea and vomiting.       Negative for changes in bowel habits.  Endocrine: Negative for cold intolerance and heat intolerance.  Genitourinary: Negative for dysuria and frequency.  Musculoskeletal: Positive for back pain. Negative for joint swelling.  Neurological: Negative for syncope and headaches.  Psychiatric/Behavioral: Negative for confusion.  Rest see pertinent positives and negatives per HPI.   Current Outpatient Medications on File  Prior to Visit  Medication Sig Dispense Refill  . amoxicillin (AMOXIL) 500 MG capsule TK 4 CAPSULES PO 1 HOUR PRIOR TO DENTAL TREATMENT    . atorvastatin (LIPITOR) 40 MG tablet TAKE 1 TABLET DAILY 90 tablet 4  . Biotin (BIOTIN 5000) 5 MG CAPS Take 1 capsule by mouth daily.    . cephALEXin (KEFLEX) 500 MG capsule     . diclofenac sodium (VOLTAREN) 1 % GEL APPLY 2 TO 4 GRAMS AA BID PRN  2  . gabapentin (NEURONTIN) 300 MG capsule TAKE 1 CAPSULE(300 MG) BY MOUTH EVERY EVENING 90 capsule 0  . hydrochlorothiazide (HYDRODIURIL) 25 MG tablet TAKE 1 TABLET DAILY 90 tablet 4  . lisinopril (ZESTRIL) 30 MG tablet TAKE 1 TABLET(30 MG) BY MOUTH DAILY 90 tablet 2  . Magnesium 400 MG CAPS Take 400 mg by mouth daily.    . Multiple Vitamin (MULTIVITAMIN) tablet Take 1 tablet by mouth daily.    Marland Kitchen omeprazole (PRILOSEC) 20 MG capsule Take 1 capsule (20 mg total) by mouth daily. 90 capsule 3  . spironolactone (ALDACTONE) 25 MG tablet TAKE 1 TABLET(25 MG) BY MOUTH DAILY 90 tablet 3   No current facility-administered medications on file prior to visit.      Past Medical History:  Diagnosis Date  . Bullous pemphigoid   . Hypertension    Allergies  Allergen Reactions  . Carvedilol Diarrhea  . Atenolol Other (See Comments)    Drops heart rate  . Hydralazine Hcl Swelling and Other (See Comments)    Swelling on face, arms red  from elbow to fingers.  . Sulfa Antibiotics Other (See Comments)    Childhood allergy   . Adhesive [Tape] Rash  . Amlodipine Rash and Other (See Comments)    flushing    Social History   Socioeconomic History  . Marital status: Widowed    Spouse name: Not on file  . Number of children: 1  . Years of education: Not on file  . Highest education level: Not on file  Occupational History  . Not on file  Social Needs  . Financial resource strain: Not on file  . Food insecurity    Worry: Not on file    Inability: Not on file  . Transportation needs    Medical: Not on file     Non-medical: Not on file  Tobacco Use  . Smoking status: Never Smoker  . Smokeless tobacco: Never Used  Substance and Sexual Activity  . Alcohol use: No    Alcohol/week: 0.0 standard drinks  . Drug use: No  . Sexual activity: Not on file  Lifestyle  . Physical activity    Days per week: Not on file    Minutes per session: Not on file  . Stress: Not on file  Relationships  . Social Herbalist on phone: Not on file    Gets together: Not on file    Attends religious service: Not on file    Active member of club or organization: Not on file    Attends meetings of clubs or organizations: Not on file    Relationship status: Not on file  Other Topics Concern  . Not on file  Social History Narrative  . Not on file    Vitals:   03/12/19 1017  BP: 116/60  Pulse: 79  Resp: 12  Temp: 97.7 F (36.5 C)  SpO2: 99%   There is no height or weight on file to calculate BMI.  Physical Exam  Nursing note and vitals reviewed. Constitutional: She is oriented to person, place, and time. She appears well-developed. No distress.  HENT:  Head: Normocephalic and atraumatic.  Mouth/Throat: Oropharynx is clear and moist and mucous membranes are normal.  Eyes: Pupils are equal, round, and reactive to light. Conjunctivae are normal.  Cardiovascular: Normal rate and regular rhythm.  No murmur heard. Pulses:      Dorsalis pedis pulses are 2+ on the right side and 2+ on the left side.  Respiratory: Effort normal and breath sounds normal. No respiratory distress.  GI: Soft. She exhibits no mass. There is no hepatomegaly. There is no abdominal tenderness.  Musculoskeletal:        General: No edema.  Lymphadenopathy:    She has no cervical adenopathy.  Neurological: She is alert and oriented to person, place, and time. She has normal strength. No cranial nerve deficit. Gait normal.  Skin: Skin is warm. No rash noted. No erythema.  2-3 mm not pigmented rounded lesion on right of nose.Marland Kitchen   Psychiatric: She has a normal mood and affect.  Well groomed, good eye contact.    ASSESSMENT AND PLAN:  Salah was seen today for follow-up.  Diagnoses and all orders for this visit:  CKD (chronic kidney disease), stage III (HCC) -     Basic metabolic panel -     Microalbumin / creatinine urine ratio -     VITAMIN D 25 Hydroxy (Vit-D Deficiency, Fractures) -     CBC  Essential hypertension -     Basic metabolic panel  Hypomagnesemia -     Magnesium  Hyponatremia Continue mild fluid restriction. Instructed about warning signs.   Essential hypertension BP adequately controlled. She does not check BPs at home because these aggravate his anxiety. Continue low-salt diet.   Hypomagnesemia Continue magnesium oxide 250 mg daily. Further recommendation will be given according to lab results.  CKD (chronic kidney disease), stage III Problem has been stable. Continue lisinopril 40 mg. Low-salt diet and avoidance of NSAIDs.   Skin lesion of face Most likely benign. Dermatology referral placed as requested.   Return in about 6 months (around 09/12/2019) for f/u and medicare visit.  -[name] was advised to return sooner than planned today if new concerns arise.       Madeeha Costantino G. Martinique, MD  [facility]. [place] office.

## 2019-03-12 NOTE — Assessment & Plan Note (Signed)
Continue magnesium oxide 250 mg daily. Further recommendation will be given according to lab results.

## 2019-03-12 NOTE — Assessment & Plan Note (Signed)
BP adequately controlled. She does not check BPs at home because these aggravate his anxiety. Continue low-salt diet.

## 2019-03-15 ENCOUNTER — Encounter: Payer: Self-pay | Admitting: Family Medicine

## 2019-03-27 DIAGNOSIS — M4125 Other idiopathic scoliosis, thoracolumbar region: Secondary | ICD-10-CM | POA: Diagnosis not present

## 2019-03-27 DIAGNOSIS — M5136 Other intervertebral disc degeneration, lumbar region: Secondary | ICD-10-CM | POA: Diagnosis not present

## 2019-04-24 DIAGNOSIS — M4125 Other idiopathic scoliosis, thoracolumbar region: Secondary | ICD-10-CM | POA: Diagnosis not present

## 2019-04-24 DIAGNOSIS — M5136 Other intervertebral disc degeneration, lumbar region: Secondary | ICD-10-CM | POA: Diagnosis not present

## 2019-05-23 DIAGNOSIS — M4125 Other idiopathic scoliosis, thoracolumbar region: Secondary | ICD-10-CM | POA: Diagnosis not present

## 2019-05-23 DIAGNOSIS — M5136 Other intervertebral disc degeneration, lumbar region: Secondary | ICD-10-CM | POA: Diagnosis not present

## 2019-06-20 DIAGNOSIS — M4125 Other idiopathic scoliosis, thoracolumbar region: Secondary | ICD-10-CM | POA: Diagnosis not present

## 2019-06-20 DIAGNOSIS — M5136 Other intervertebral disc degeneration, lumbar region: Secondary | ICD-10-CM | POA: Diagnosis not present

## 2019-06-20 DIAGNOSIS — Z6823 Body mass index (BMI) 23.0-23.9, adult: Secondary | ICD-10-CM | POA: Diagnosis not present

## 2019-07-02 ENCOUNTER — Other Ambulatory Visit: Payer: Self-pay | Admitting: Family Medicine

## 2019-07-02 DIAGNOSIS — E782 Mixed hyperlipidemia: Secondary | ICD-10-CM

## 2019-07-19 DIAGNOSIS — M5136 Other intervertebral disc degeneration, lumbar region: Secondary | ICD-10-CM | POA: Diagnosis not present

## 2019-07-19 DIAGNOSIS — M4125 Other idiopathic scoliosis, thoracolumbar region: Secondary | ICD-10-CM | POA: Diagnosis not present

## 2019-07-20 ENCOUNTER — Other Ambulatory Visit: Payer: Self-pay | Admitting: Orthopaedic Surgery

## 2019-07-20 DIAGNOSIS — M4125 Other idiopathic scoliosis, thoracolumbar region: Secondary | ICD-10-CM

## 2019-07-25 DIAGNOSIS — R269 Unspecified abnormalities of gait and mobility: Secondary | ICD-10-CM | POA: Diagnosis not present

## 2019-07-25 DIAGNOSIS — M6281 Muscle weakness (generalized): Secondary | ICD-10-CM | POA: Diagnosis not present

## 2019-07-25 DIAGNOSIS — M4125 Other idiopathic scoliosis, thoracolumbar region: Secondary | ICD-10-CM | POA: Diagnosis not present

## 2019-07-30 DIAGNOSIS — M6281 Muscle weakness (generalized): Secondary | ICD-10-CM | POA: Diagnosis not present

## 2019-07-30 DIAGNOSIS — R269 Unspecified abnormalities of gait and mobility: Secondary | ICD-10-CM | POA: Diagnosis not present

## 2019-07-30 DIAGNOSIS — M4125 Other idiopathic scoliosis, thoracolumbar region: Secondary | ICD-10-CM | POA: Diagnosis not present

## 2019-08-01 DIAGNOSIS — M4125 Other idiopathic scoliosis, thoracolumbar region: Secondary | ICD-10-CM | POA: Diagnosis not present

## 2019-08-01 DIAGNOSIS — M6281 Muscle weakness (generalized): Secondary | ICD-10-CM | POA: Diagnosis not present

## 2019-08-01 DIAGNOSIS — R269 Unspecified abnormalities of gait and mobility: Secondary | ICD-10-CM | POA: Diagnosis not present

## 2019-08-02 ENCOUNTER — Other Ambulatory Visit: Payer: Self-pay | Admitting: Family Medicine

## 2019-08-02 DIAGNOSIS — I10 Essential (primary) hypertension: Secondary | ICD-10-CM

## 2019-08-03 ENCOUNTER — Encounter: Payer: Self-pay | Admitting: Family Medicine

## 2019-08-06 DIAGNOSIS — M6281 Muscle weakness (generalized): Secondary | ICD-10-CM | POA: Diagnosis not present

## 2019-08-06 DIAGNOSIS — M4125 Other idiopathic scoliosis, thoracolumbar region: Secondary | ICD-10-CM | POA: Diagnosis not present

## 2019-08-06 DIAGNOSIS — R269 Unspecified abnormalities of gait and mobility: Secondary | ICD-10-CM | POA: Diagnosis not present

## 2019-08-07 ENCOUNTER — Ambulatory Visit
Admission: RE | Admit: 2019-08-07 | Discharge: 2019-08-07 | Disposition: A | Discharge status: Home / Self Care | Payer: Medicare Other | Source: Ambulatory Visit | Attending: Orthopaedic Surgery | Admitting: Orthopaedic Surgery

## 2019-08-07 ENCOUNTER — Other Ambulatory Visit: Payer: Self-pay

## 2019-08-07 DIAGNOSIS — M5124 Other intervertebral disc displacement, thoracic region: Secondary | ICD-10-CM | POA: Diagnosis not present

## 2019-08-07 DIAGNOSIS — M4125 Other idiopathic scoliosis, thoracolumbar region: Secondary | ICD-10-CM

## 2019-08-08 DIAGNOSIS — R269 Unspecified abnormalities of gait and mobility: Secondary | ICD-10-CM | POA: Diagnosis not present

## 2019-08-08 DIAGNOSIS — M4125 Other idiopathic scoliosis, thoracolumbar region: Secondary | ICD-10-CM | POA: Diagnosis not present

## 2019-08-08 DIAGNOSIS — M6281 Muscle weakness (generalized): Secondary | ICD-10-CM | POA: Diagnosis not present

## 2019-08-13 DIAGNOSIS — M6281 Muscle weakness (generalized): Secondary | ICD-10-CM | POA: Diagnosis not present

## 2019-08-13 DIAGNOSIS — R269 Unspecified abnormalities of gait and mobility: Secondary | ICD-10-CM | POA: Diagnosis not present

## 2019-08-13 DIAGNOSIS — M4125 Other idiopathic scoliosis, thoracolumbar region: Secondary | ICD-10-CM | POA: Diagnosis not present

## 2019-08-15 DIAGNOSIS — M6281 Muscle weakness (generalized): Secondary | ICD-10-CM | POA: Diagnosis not present

## 2019-08-15 DIAGNOSIS — M4125 Other idiopathic scoliosis, thoracolumbar region: Secondary | ICD-10-CM | POA: Diagnosis not present

## 2019-08-15 DIAGNOSIS — R269 Unspecified abnormalities of gait and mobility: Secondary | ICD-10-CM | POA: Diagnosis not present

## 2019-08-22 DIAGNOSIS — M4125 Other idiopathic scoliosis, thoracolumbar region: Secondary | ICD-10-CM | POA: Diagnosis not present

## 2019-08-22 DIAGNOSIS — M6281 Muscle weakness (generalized): Secondary | ICD-10-CM | POA: Diagnosis not present

## 2019-08-22 DIAGNOSIS — R269 Unspecified abnormalities of gait and mobility: Secondary | ICD-10-CM | POA: Diagnosis not present

## 2019-08-25 ENCOUNTER — Ambulatory Visit: Payer: Medicare Other | Attending: Internal Medicine

## 2019-08-25 DIAGNOSIS — Z23 Encounter for immunization: Secondary | ICD-10-CM | POA: Insufficient documentation

## 2019-08-25 NOTE — Progress Notes (Signed)
   Covid-19 Vaccination Clinic  Name:  Amber Baird    MRN: [id number] DOB: March 08, 1935  08/25/2019  [name] was observed post Covid-19 immunization for 15 minutes without incidence. She was provided with Vaccine Information Sheet and instruction to access the V-Safe system.   [name] was instructed to call 911 with any severe reactions post vaccine: Marland Kitchen Difficulty breathing  . Swelling of your face and throat  . A fast heartbeat  . A bad rash all over your body  . Dizziness and weakness    Immunizations Administered    Name Date Dose VIS Date Route   Pfizer COVID-19 Vaccine 08/25/2019 12:35 PM 0.3 mL 07/13/2019 Intramuscular   Manufacturer: [place]   Lot: [id number]   [place]: [id number]

## 2019-08-27 DIAGNOSIS — M4125 Other idiopathic scoliosis, thoracolumbar region: Secondary | ICD-10-CM | POA: Diagnosis not present

## 2019-08-27 DIAGNOSIS — M6281 Muscle weakness (generalized): Secondary | ICD-10-CM | POA: Diagnosis not present

## 2019-08-27 DIAGNOSIS — R269 Unspecified abnormalities of gait and mobility: Secondary | ICD-10-CM | POA: Diagnosis not present

## 2019-08-28 DIAGNOSIS — F0634 Mood disorder due to known physiological condition with mixed features: Secondary | ICD-10-CM | POA: Diagnosis not present

## 2019-08-29 DIAGNOSIS — F0634 Mood disorder due to known physiological condition with mixed features: Secondary | ICD-10-CM | POA: Diagnosis not present

## 2019-08-29 DIAGNOSIS — R269 Unspecified abnormalities of gait and mobility: Secondary | ICD-10-CM | POA: Diagnosis not present

## 2019-08-29 DIAGNOSIS — M4125 Other idiopathic scoliosis, thoracolumbar region: Secondary | ICD-10-CM | POA: Diagnosis not present

## 2019-08-29 DIAGNOSIS — M6281 Muscle weakness (generalized): Secondary | ICD-10-CM | POA: Diagnosis not present

## 2019-08-30 DIAGNOSIS — F0634 Mood disorder due to known physiological condition with mixed features: Secondary | ICD-10-CM | POA: Diagnosis not present

## 2019-08-31 DIAGNOSIS — F0634 Mood disorder due to known physiological condition with mixed features: Secondary | ICD-10-CM | POA: Diagnosis not present

## 2019-09-01 DIAGNOSIS — F0634 Mood disorder due to known physiological condition with mixed features: Secondary | ICD-10-CM | POA: Diagnosis not present

## 2019-09-05 DIAGNOSIS — M6281 Muscle weakness (generalized): Secondary | ICD-10-CM | POA: Diagnosis not present

## 2019-09-05 DIAGNOSIS — R269 Unspecified abnormalities of gait and mobility: Secondary | ICD-10-CM | POA: Diagnosis not present

## 2019-09-05 DIAGNOSIS — M4125 Other idiopathic scoliosis, thoracolumbar region: Secondary | ICD-10-CM | POA: Diagnosis not present

## 2019-09-07 DIAGNOSIS — M6281 Muscle weakness (generalized): Secondary | ICD-10-CM | POA: Diagnosis not present

## 2019-09-07 DIAGNOSIS — M4125 Other idiopathic scoliosis, thoracolumbar region: Secondary | ICD-10-CM | POA: Diagnosis not present

## 2019-09-07 DIAGNOSIS — R269 Unspecified abnormalities of gait and mobility: Secondary | ICD-10-CM | POA: Diagnosis not present

## 2019-09-15 ENCOUNTER — Ambulatory Visit: Payer: Medicare Other | Attending: Internal Medicine

## 2019-09-15 DIAGNOSIS — Z23 Encounter for immunization: Secondary | ICD-10-CM

## 2019-09-15 NOTE — Progress Notes (Signed)
   Covid-19 Vaccination Clinic  Name:  Amber Baird    MRN: [id number] DOB: 1935-04-21  09/15/2019  [name] was observed post Covid-19 immunization for 15 minutes without incidence. She was provided with Vaccine Information Sheet and instruction to access the V-Safe system.   [name] was instructed to call 911 with any severe reactions post vaccine: Marland Kitchen Difficulty breathing  . Swelling of your face and throat  . A fast heartbeat  . A bad rash all over your body  . Dizziness and weakness    Immunizations Administered    Name Date Dose VIS Date Route   Pfizer COVID-19 Vaccine 09/15/2019  1:53 PM 0.3 mL 07/13/2019 Intramuscular   Manufacturer: [place]   Lot: [id number]   [place]: [id number]

## 2019-10-02 DIAGNOSIS — G894 Chronic pain syndrome: Secondary | ICD-10-CM | POA: Diagnosis not present

## 2019-10-11 DIAGNOSIS — M545 Low back pain: Secondary | ICD-10-CM | POA: Diagnosis not present

## 2019-10-11 DIAGNOSIS — M4716 Other spondylosis with myelopathy, lumbar region: Secondary | ICD-10-CM | POA: Diagnosis not present

## 2019-10-11 DIAGNOSIS — M4125 Other idiopathic scoliosis, thoracolumbar region: Secondary | ICD-10-CM | POA: Diagnosis not present

## 2019-10-11 DIAGNOSIS — G894 Chronic pain syndrome: Secondary | ICD-10-CM | POA: Diagnosis not present

## 2019-10-19 ENCOUNTER — Other Ambulatory Visit: Payer: Self-pay | Admitting: Family Medicine

## 2019-10-19 DIAGNOSIS — G4700 Insomnia, unspecified: Secondary | ICD-10-CM

## 2019-10-22 ENCOUNTER — Telehealth: Payer: Self-pay

## 2019-10-22 NOTE — Telephone Encounter (Signed)
Patient needs a surgery clearance appointment for her surgery. Okay to use an afternoon virtual slot if needed.

## 2019-10-22 NOTE — Telephone Encounter (Signed)
Pt scheduled for March 24 at 2pm

## 2019-10-23 ENCOUNTER — Other Ambulatory Visit: Payer: Self-pay

## 2019-10-24 ENCOUNTER — Ambulatory Visit (INDEPENDENT_AMBULATORY_CARE_PROVIDER_SITE_OTHER): Payer: Medicare Other | Admitting: Family Medicine

## 2019-10-24 ENCOUNTER — Encounter: Payer: Self-pay | Admitting: Family Medicine

## 2019-10-24 VITALS — BP 128/70 | HR 69 | Temp 96.5°F | Resp 12 | Ht 62.0 in | Wt 130.0 lb

## 2019-10-24 DIAGNOSIS — Z01818 Encounter for other preprocedural examination: Secondary | ICD-10-CM | POA: Diagnosis not present

## 2019-10-24 DIAGNOSIS — N1832 Chronic kidney disease, stage 3b: Secondary | ICD-10-CM | POA: Diagnosis not present

## 2019-10-24 DIAGNOSIS — E871 Hypo-osmolality and hyponatremia: Secondary | ICD-10-CM

## 2019-10-24 DIAGNOSIS — I10 Essential (primary) hypertension: Secondary | ICD-10-CM | POA: Diagnosis not present

## 2019-10-24 NOTE — Patient Instructions (Signed)
A few things to remember from today's visit:   Pre-operative clearance - Plan: EKG 12-Lead  Essential hypertension  Hyponatremia  Stage 3b chronic kidney disease  Preop examination  I will send copy of my note to surgeon. EKG today no changes when compared with prior ones. Labs are most likely be done during your pre op visit.   Please be sure medication list is accurate. If a new problem present, please set up appointment sooner than planned today.

## 2019-10-24 NOTE — Progress Notes (Signed)
HPI:  Chief Complaint  Patient presents with  . surgery clearance    [name] is a 85 y.o. female, who is here today for surgical clearance requested by Dr. Patrice Paradise. She was last seen on 03/12/2019. She has had persistent low back pain. Pain has not improved with conservative treatment. It is interfering with her ADL's.  She is planning on having thoracic laminectomy for spinal cord stimulator lead and insertion of internal pulse generator on 11/06/2019. She feels like surgical procedure will improve quality of life.  She has hx of HTN,HLD,and CKD III. Hx of heart murmur, I have heard it sometimes.  Problems are well controlled with pharmacologic treatment.  No Hx of CAD,diabetes,or tobacco use. Negative for CP,dyspnea,dizziness,or diaphoresis upon pushing the trash can up hill. She avoid certain activities that could aggravate pain like going up stairs or carrying heavy groceries.  Echo on 02/05/2016 showed LVEF 65 to 70% and grade 1 diastolic dysfunction. Denies orthopnea,PND,or edema.  She is on Lisinopril 30 mg daily,Spironolactone 25 mg daily,and HCTZ 25 mg daily. Hx of hypoNa++. She has not tolerated other antihypertensive medications.  Lab Results  Component Value Date   CREATININE 1.23 (H) 03/12/2019   BUN 25 (H) 03/12/2019   NA 134 (L) 03/12/2019   K 4.5 03/12/2019   CL 98 03/12/2019   CO2 25 03/12/2019   HLD: Atorvastatin 40 mg daily.  Lab Results  Component Value Date   CHOL 173 09/04/2018   HDL 57.90 09/04/2018   LDLCALC 87 09/04/2018   TRIG 140.0 09/04/2018   CHOLHDL 3 09/04/2018    Thyroglossal cyst removal about 20 years ago. Appendicectomy and hysterectomy 20+ years ago. She tolerated general anesthesia well. No complications during or after procedure.  According to pt, she is having pre op visit on 10/29/19, she thinks she may be having labs. She needs an ekg today.  She was already instructed to hold on NSAID's, including  Aspirin, and no supplements.   Immunization History  Administered Date(s) Administered  . Influenza, High Dose Seasonal PF 05/20/2016, 05/31/2018  . Influenza-Unspecified 05/20/2016, 05/26/2017  . PFIZER SARS-COV-2 Vaccination 08/25/2019, 09/15/2019  . Pneumococcal Conjugate-13 08/02/2013  . Pneumococcal Polysaccharide-23 08/02/2008, 07/20/2013  . Tdap 10/07/2016  . Zoster 08/03/2003, 12/09/2016  . Zoster Recombinat (Shingrix) 12/09/2016, 03/21/2017    Review of Systems  Constitutional: Negative for activity change, appetite change, fatigue and fever.  HENT: Negative for mouth sores, nosebleeds and sore throat.   Eyes: Negative for redness and visual disturbance.  Respiratory: Negative for cough and wheezing.   Gastrointestinal: Negative for abdominal pain, nausea and vomiting.       Negative for changes in bowel habits.  Genitourinary: Negative for decreased urine volume, dysuria and hematuria.  Musculoskeletal: Positive for back pain. Negative for gait problem.  Skin: Negative for rash and wound.  Neurological: Negative for syncope, weakness and headaches.  Rest see pertinent positives and negatives per HPI.  Current Outpatient Medications on File Prior to Visit  Medication Sig Dispense Refill  . amoxicillin (AMOXIL) 500 MG capsule TK 4 CAPSULES PO 1 HOUR PRIOR TO DENTAL TREATMENT    . atorvastatin (LIPITOR) 40 MG tablet TAKE 1 TABLET DAILY 90 tablet 3  . Biotin (BIOTIN 5000) 5 MG CAPS Take 1 capsule by mouth daily.    . diclofenac sodium (VOLTAREN) 1 % GEL APPLY 2 TO 4 GRAMS AA BID PRN  2  . gabapentin (NEURONTIN) 300 MG capsule TAKE 1 CAPSULE(300 MG) BY MOUTH  EVERY EVENING 90 capsule 0  . hydrochlorothiazide (HYDRODIURIL) 25 MG tablet TAKE 1 TABLET DAILY 90 tablet 3  . lisinopril (ZESTRIL) 30 MG tablet TAKE 1 TABLET(30 MG) BY MOUTH DAILY 90 tablet 2  . Magnesium 400 MG CAPS Take 400 mg by mouth daily.    . Multiple Vitamin (MULTIVITAMIN) tablet Take 1 tablet by mouth daily.     Marland Kitchen omeprazole (PRILOSEC) 20 MG capsule Take 1 capsule (20 mg total) by mouth daily. 90 capsule 3  . spironolactone (ALDACTONE) 25 MG tablet TAKE 1 TABLET(25 MG) BY MOUTH DAILY 90 tablet 3   No current facility-administered medications on file prior to visit.   Past Medical History:  Diagnosis Date  . Bullous pemphigoid   . Hypertension    Past Surgical History:  Procedure Laterality Date  . APPENDECTOMY  1980  . BREAST BIOPSY  1980  . VAGINAL HYSTERECTOMY  1980   Allergies  Allergen Reactions  . Carvedilol Diarrhea  . Atenolol Other (See Comments)    Drops heart rate  . Hydralazine Hcl Swelling and Other (See Comments)    Swelling on face, arms red from elbow to fingers.  . Sulfa Antibiotics Other (See Comments)    Childhood allergy   . Adhesive [Tape] Rash  . Amlodipine Rash and Other (See Comments)    flushing    Social History   Socioeconomic History  . Marital status: Widowed    Spouse name: Not on file  . Number of children: 1  . Years of education: Not on file  . Highest education level: Not on file  Occupational History  . Not on file  Tobacco Use  . Smoking status: Never Smoker  . Smokeless tobacco: Never Used  Substance and Sexual Activity  . Alcohol use: No    Alcohol/week: 0.0 standard drinks  . Drug use: No  . Sexual activity: Not on file  Other Topics Concern  . Not on file  Social History Narrative  . Not on file   Social Determinants of Health   Financial Resource Strain:   . Difficulty of Paying Living Expenses:   Food Insecurity:   . Worried About Charity fundraiser in the Last Year:   . Arboriculturist in the Last Year:   Transportation Needs:   . Film/video editor (Medical):   Marland Kitchen Lack of Transportation (Non-Medical):   Physical Activity:   . Days of Exercise per Week:   . Minutes of Exercise per Session:   Stress:   . Feeling of Stress :   Social Connections:   . Frequency of Communication with Friends and Family:   .  Frequency of Social Gatherings with Friends and Family:   . Attends Religious Services:   . Active Member of Clubs or Organizations:   . Attends Archivist Meetings:   Marland Kitchen Marital Status:    Vitals:   10/24/19 1356  BP: 128/70  Pulse: 69  Resp: 12  Temp: (!) 96.5 F (35.8 C)  SpO2: 98%   Body mass index is 23.78 kg/m.  Physical Exam  Nursing note and vitals reviewed. Constitutional: She is oriented to person, place, and time. She appears well-developed and well-nourished. No distress.  HENT:  Head: Normocephalic and atraumatic.  Mouth/Throat: Oropharynx is clear and moist and mucous membranes are normal.  Eyes: Pupils are equal, round, and reactive to light. Conjunctivae are normal.  Neck: No JVD present.  Cardiovascular: Normal rate and regular rhythm.  No murmur heard. Pulses:  Dorsalis pedis pulses are 2+ on the right side and 2+ on the left side.  No murmur heard today.  Respiratory: Effort normal and breath sounds normal. No respiratory distress.  GI: Soft. She exhibits no mass. There is no hepatomegaly. There is no abdominal tenderness.  Musculoskeletal:        General: No edema.  Lymphadenopathy:    She has no cervical adenopathy.  Neurological: She is alert and oriented to person, place, and time. She has normal strength. No cranial nerve deficit. Gait normal.  Skin: Skin is warm. No rash noted. No erythema.  Psychiatric: She has a normal mood and affect.  Well groomed, good eye contact.    ASSESSMENT AND PLAN:  Ms. Lashema was seen today for surgery clearance.  Diagnoses and all orders for this visit:  Pre-operative clearance Chronic problem well controlled. We discussed surgical risks. Goldman cardiac risk 5, 1-1% risk of severe complications. Revised cardiac risk index, class I risk (123456 risk of CV complications within the following 30 days after surgery). Monitor hydration state and BP. EKG today: SR,normal axis, RBBB. When compared with  EKG in 2017 no significant changes. Renal function before and after procedure. She can take her antihypertensive meds and statin med day of surgery. She would like to proceed with procedure, cleared. Clearance form completed and copy of note will be faxed to Dr Towanda Malkin office.  -     EKG 12-Lead  Essential hypertension BP adequately controlled. No changes in current management. Continue low salt diet.  Hyponatremia It has been stable. I am not repeating BMP today, it can be done Monday during pre op evaluation with anesthesiologist. If she is not having lab, she needs to arrange lab appt here in the office.  Stage 3b chronic kidney disease Continue low salt diet,adequate hydration, and NSAID's avoidance. Continue Lisinopril.                                                          Return in about 4 months (around 02/23/2020).   Cassy Sprowl G. Martinique, MD  [facility]. [place] office.

## 2019-10-26 DIAGNOSIS — M545 Low back pain: Secondary | ICD-10-CM | POA: Diagnosis not present

## 2019-10-26 DIAGNOSIS — M4125 Other idiopathic scoliosis, thoracolumbar region: Secondary | ICD-10-CM | POA: Diagnosis not present

## 2019-10-26 DIAGNOSIS — M4716 Other spondylosis with myelopathy, lumbar region: Secondary | ICD-10-CM | POA: Diagnosis not present

## 2019-10-26 DIAGNOSIS — G894 Chronic pain syndrome: Secondary | ICD-10-CM | POA: Diagnosis not present

## 2019-10-29 DIAGNOSIS — R799 Abnormal finding of blood chemistry, unspecified: Secondary | ICD-10-CM | POA: Diagnosis not present

## 2019-10-29 DIAGNOSIS — Z01812 Encounter for preprocedural laboratory examination: Secondary | ICD-10-CM | POA: Diagnosis not present

## 2019-10-29 DIAGNOSIS — G894 Chronic pain syndrome: Secondary | ICD-10-CM | POA: Diagnosis not present

## 2019-10-29 DIAGNOSIS — R8290 Unspecified abnormal findings in urine: Secondary | ICD-10-CM | POA: Diagnosis not present

## 2019-10-30 DIAGNOSIS — Z20822 Contact with and (suspected) exposure to covid-19: Secondary | ICD-10-CM | POA: Diagnosis not present

## 2019-10-30 DIAGNOSIS — G894 Chronic pain syndrome: Secondary | ICD-10-CM | POA: Diagnosis not present

## 2019-10-30 DIAGNOSIS — Z01812 Encounter for preprocedural laboratory examination: Secondary | ICD-10-CM | POA: Diagnosis not present

## 2019-10-31 ENCOUNTER — Telehealth: Payer: Self-pay | Admitting: Family Medicine

## 2019-10-31 NOTE — Telephone Encounter (Signed)
Melissa from [facility] called back in regards to previous message. She informed she is still waiting for this information to clear pt for surgery.

## 2019-10-31 NOTE — Telephone Encounter (Signed)
Melissa from [facility] is requesting pt most recent EKG results be faxed to her for pt surgical clearance.   Fax: [phone]

## 2019-10-31 NOTE — Telephone Encounter (Signed)
EKG faxed to [facility] hospital.

## 2019-11-06 DIAGNOSIS — Z462 Encounter for fitting and adjustment of other devices related to nervous system and special senses: Secondary | ICD-10-CM | POA: Diagnosis not present

## 2019-11-06 DIAGNOSIS — G894 Chronic pain syndrome: Secondary | ICD-10-CM | POA: Diagnosis not present

## 2019-11-06 DIAGNOSIS — M4716 Other spondylosis with myelopathy, lumbar region: Secondary | ICD-10-CM | POA: Diagnosis not present

## 2019-11-06 DIAGNOSIS — G629 Polyneuropathy, unspecified: Secondary | ICD-10-CM | POA: Diagnosis not present

## 2019-11-06 DIAGNOSIS — M546 Pain in thoracic spine: Secondary | ICD-10-CM | POA: Diagnosis not present

## 2019-11-06 DIAGNOSIS — M4125 Other idiopathic scoliosis, thoracolumbar region: Secondary | ICD-10-CM | POA: Diagnosis not present

## 2019-11-06 DIAGNOSIS — M545 Low back pain: Secondary | ICD-10-CM | POA: Diagnosis not present

## 2019-11-07 DIAGNOSIS — G894 Chronic pain syndrome: Secondary | ICD-10-CM | POA: Diagnosis not present

## 2019-11-07 DIAGNOSIS — M546 Pain in thoracic spine: Secondary | ICD-10-CM | POA: Diagnosis not present

## 2019-11-07 DIAGNOSIS — G629 Polyneuropathy, unspecified: Secondary | ICD-10-CM | POA: Diagnosis not present

## 2019-11-10 ENCOUNTER — Other Ambulatory Visit: Payer: Self-pay | Admitting: Family Medicine

## 2019-11-10 DIAGNOSIS — I10 Essential (primary) hypertension: Secondary | ICD-10-CM

## 2019-11-20 ENCOUNTER — Other Ambulatory Visit: Payer: Self-pay | Admitting: Family Medicine

## 2019-11-20 DIAGNOSIS — K219 Gastro-esophageal reflux disease without esophagitis: Secondary | ICD-10-CM

## 2019-12-04 DIAGNOSIS — M545 Low back pain: Secondary | ICD-10-CM | POA: Diagnosis not present

## 2020-02-01 ENCOUNTER — Other Ambulatory Visit: Payer: Self-pay | Admitting: Family Medicine

## 2020-02-01 DIAGNOSIS — G4700 Insomnia, unspecified: Secondary | ICD-10-CM

## 2020-04-28 DIAGNOSIS — Z23 Encounter for immunization: Secondary | ICD-10-CM | POA: Diagnosis not present

## 2020-05-01 DIAGNOSIS — M545 Low back pain: Secondary | ICD-10-CM | POA: Diagnosis not present

## 2020-05-01 DIAGNOSIS — G894 Chronic pain syndrome: Secondary | ICD-10-CM | POA: Diagnosis not present

## 2020-05-01 DIAGNOSIS — Z6823 Body mass index (BMI) 23.0-23.9, adult: Secondary | ICD-10-CM | POA: Diagnosis not present

## 2020-05-01 DIAGNOSIS — M47896 Other spondylosis, lumbar region: Secondary | ICD-10-CM | POA: Diagnosis not present

## 2020-05-07 ENCOUNTER — Telehealth: Payer: Self-pay | Admitting: Family Medicine

## 2020-05-07 NOTE — Telephone Encounter (Signed)
Left message for patient to call back and schedule Medicare Annual Wellness Visit (AWV) either virtually or in office.  Last AWV 01/27/18; please schedule at anytime with [facility] Nurse Health Advisor 2.  This should be a 45 minute visit.

## 2020-05-07 NOTE — Telephone Encounter (Signed)
Patient is scheduled for 10/11 at 9:45 AM

## 2020-05-11 ENCOUNTER — Telehealth: Payer: Self-pay | Admitting: Family Medicine

## 2020-05-12 ENCOUNTER — Ambulatory Visit: Payer: Medicare Other

## 2020-05-14 ENCOUNTER — Ambulatory Visit: Payer: Medicare Other

## 2020-05-14 ENCOUNTER — Other Ambulatory Visit: Payer: Self-pay

## 2020-05-14 VITALS — BP 122/80 | HR 81 | Temp 98.5°F | Ht 61.0 in | Wt 129.3 lb

## 2020-05-14 DIAGNOSIS — Z780 Asymptomatic menopausal state: Secondary | ICD-10-CM

## 2020-05-14 NOTE — Patient Instructions (Signed)
[name] , Thank you for taking time to come for your Medicare Wellness Visit. I appreciate your ongoing commitment to your health goals. Please review the following plan we discussed and let me know if I can assist you in the future.   Screening recommendations/referrals: Colonoscopy: No longer required Mammogram: No longer required  Bone Density: Orders placed this visit Recommended yearly ophthalmology/optometry visit for glaucoma screening and checkup Recommended yearly dental visit for hygiene and checkup  Vaccinations: Influenza vaccine: up to date, next due fall 2022 Pneumococcal vaccine: Completed series Tdap vaccine: Up to date, next due 10/08/2026 Shingles vaccine: Completed series    Advanced directives: Please bring a copy of your [place] to your next visit so that we may scan it into your chart.  Conditions/risks identified: None   Next appointment: 05/19/2020 @ 11:30 am with Dr. Martinique   Preventive Care 12 Years and Older, Female Preventive care refers to lifestyle choices and visits with your health care provider that can promote health and wellness. What does preventive care include?  A yearly physical exam. This is also called an annual well check.  Dental exams once or twice a year.  Routine eye exams. Ask your health care provider how often you should have your eyes checked.  Personal lifestyle choices, including:  Daily care of your teeth and gums.  Regular physical activity.  Eating a healthy diet.  Avoiding tobacco and drug use.  Limiting alcohol use.  Practicing safe sex.  Taking low-dose aspirin every day.  Taking vitamin and mineral supplements as recommended by your health care provider. What happens during an annual well check? The services and screenings done by your health care provider during your annual well check will depend on your age, overall health, lifestyle risk factors, and family history of  disease. Counseling  Your health care provider may ask you questions about your:  Alcohol use.  Tobacco use.  Drug use.  Emotional well-being.  Home and relationship well-being.  Sexual activity.  Eating habits.  History of falls.  Memory and ability to understand (cognition).  Work and work Statistician.  Reproductive health. Screening  You may have the following tests or measurements:  Height, weight, and BMI.  Blood pressure.  Lipid and cholesterol levels. These may be checked every 5 years, or more frequently if you are over 79 years old.  Skin check.  Lung cancer screening. You may have this screening every year starting at age 58 if you have a 30-pack-year history of smoking and currently smoke or have quit within the past 15 years.  Fecal occult blood test (FOBT) of the stool. You may have this test every year starting at age 38.  Flexible sigmoidoscopy or colonoscopy. You may have a sigmoidoscopy every 5 years or a colonoscopy every 10 years starting at age 80.  Hepatitis C blood test.  Hepatitis B blood test.  Sexually transmitted disease (STD) testing.  Diabetes screening. This is done by checking your blood sugar (glucose) after you have not eaten for a while (fasting). You may have this done every 1-3 years.  Bone density scan. This is done to screen for osteoporosis. You may have this done starting at age 56.  Mammogram. This may be done every 1-2 years. Talk to your health care provider about how often you should have regular mammograms. Talk with your health care provider about your test results, treatment options, and if necessary, the need for more tests. Vaccines  Your health  care provider may recommend certain vaccines, such as:  Influenza vaccine. This is recommended every year.  Tetanus, diphtheria, and acellular pertussis (Tdap, Td) vaccine. You may need a Td booster every 10 years.  Zoster vaccine. You may need this after age  81.  Pneumococcal 13-valent conjugate (PCV13) vaccine. One dose is recommended after age 1.  Pneumococcal polysaccharide (PPSV23) vaccine. One dose is recommended after age 45. Talk to your health care provider about which screenings and vaccines you need and how often you need them. This information is not intended to replace advice given to you by your health care provider. Make sure you discuss any questions you have with your health care provider. Document Released: 08/15/2015 Document Revised: 04/07/2016 Document Reviewed: 05/20/2015 Elsevier Interactive Patient Education  2017 [place] Prevention in the Home Falls can cause injuries. They can happen to people of all ages. There are many things you can do to make your home safe and to help prevent falls. What can I do on the outside of my home?  Regularly fix the edges of walkways and driveways and fix any cracks.  Remove anything that might make you trip as you walk through a door, such as a raised step or threshold.  Trim any bushes or trees on the path to your home.  Use bright outdoor lighting.  Clear any walking paths of anything that might make someone trip, such as rocks or tools.  Regularly check to see if handrails are loose or broken. Make sure that both sides of any steps have handrails.  Any raised decks and porches should have guardrails on the edges.  Have any leaves, snow, or ice cleared regularly.  Use sand or salt on walking paths during winter.  Clean up any spills in your garage right away. This includes oil or grease spills. What can I do in the bathroom?  Use night lights.  Install grab bars by the toilet and in the tub and shower. Do not use towel bars as grab bars.  Use non-skid mats or decals in the tub or shower.  If you need to sit down in the shower, use a plastic, non-slip stool.  Keep the floor dry. Clean up any water that spills on the floor as soon as it happens.  Remove  soap buildup in the tub or shower regularly.  Attach bath mats securely with double-sided non-slip rug tape.  Do not have throw rugs and other things on the floor that can make you trip. What can I do in the bedroom?  Use night lights.  Make sure that you have a light by your bed that is easy to reach.  Do not use any sheets or blankets that are too big for your bed. They should not hang down onto the floor.  Have a firm chair that has side arms. You can use this for support while you get dressed.  Do not have throw rugs and other things on the floor that can make you trip. What can I do in the kitchen?  Clean up any spills right away.  Avoid walking on wet floors.  Keep items that you use a lot in easy-to-reach places.  If you need to reach something above you, use a strong step stool that has a grab bar.  Keep electrical cords out of the way.  Do not use floor polish or wax that makes floors slippery. If you must use wax, use non-skid floor wax.  Do not have  throw rugs and other things on the floor that can make you trip. What can I do with my stairs?  Do not leave any items on the stairs.  Make sure that there are handrails on both sides of the stairs and use them. Fix handrails that are broken or loose. Make sure that handrails are as long as the stairways.  Check any carpeting to make sure that it is firmly attached to the stairs. Fix any carpet that is loose or worn.  Avoid having throw rugs at the top or bottom of the stairs. If you do have throw rugs, attach them to the floor with carpet tape.  Make sure that you have a light switch at the top of the stairs and the bottom of the stairs. If you do not have them, ask someone to add them for you. What else can I do to help prevent falls?  Wear shoes that:  Do not have high heels.  Have rubber bottoms.  Are comfortable and fit you well.  Are closed at the toe. Do not wear sandals.  If you use a  stepladder:  Make sure that it is fully opened. Do not climb a closed stepladder.  Make sure that both sides of the stepladder are locked into place.  Ask someone to hold it for you, if possible.  Clearly mark and make sure that you can see:  Any grab bars or handrails.  First and last steps.  Where the edge of each step is.  Use tools that help you move around (mobility aids) if they are needed. These include:  Canes.  Walkers.  Scooters.  Crutches.  Turn on the lights when you go into a dark area. Replace any light bulbs as soon as they burn out.  Set up your furniture so you have a clear path. Avoid moving your furniture around.  If any of your floors are uneven, fix them.  If there are any pets around you, be aware of where they are.  Review your medicines with your doctor. Some medicines can make you feel dizzy. This can increase your chance of falling. Ask your doctor what other things that you can do to help prevent falls. This information is not intended to replace advice given to you by your health care provider. Make sure you discuss any questions you have with your health care provider. Document Released: 05/15/2009 Document Revised: 12/25/2015 Document Reviewed: 08/23/2014 Elsevier Interactive Patient Education  2017 [employer].

## 2020-05-14 NOTE — Progress Notes (Signed)
Subjective:   Amber Baird is a 85 y.o. female who presents for Medicare Annual (Subsequent) preventive examination.  Review of Systems    N/A  Cardiac Risk Factors include: advanced age (>11men, >37 women);hypertension     Objective:    Today's Vitals   05/14/20 1035 05/14/20 1036  BP: 122/80   Pulse: 81   Temp: 98.5 F (36.9 C)   TempSrc: Oral   Weight: 129 lb 5 oz (58.7 kg)   Height: 5\' 1"  (1.549 m)   PainSc:  4    Body mass index is 24.43 kg/m.  Advanced Directives 05/14/2020 01/27/2018 05/20/2016 05/20/2016 01/23/2016 01/13/2016  Does Patient Have a Medical Advance Directive? Yes Yes Yes Yes Yes Yes  Type of Advance Directive [place]  Does patient want to make changes to medical advance directive? No - Patient declined - - - - -  Copy of [place] in Chart? No - copy requested - No - copy requested No - copy requested - No - copy requested    Current Medications (verified) Outpatient Encounter Medications as of 05/14/2020  Medication Sig  . atorvastatin (LIPITOR) 40 MG tablet TAKE 1 TABLET DAILY  . Biotin (BIOTIN 5000) 5 MG CAPS Take 1 capsule by mouth daily.  Marland Kitchen gabapentin (NEURONTIN) 300 MG capsule TAKE 1 CAPSULE(300 MG) BY MOUTH EVERY EVENING  . hydrochlorothiazide (HYDRODIURIL) 25 MG tablet TAKE 1 TABLET DAILY  . lisinopril (ZESTRIL) 30 MG tablet TAKE 1 TABLET(30 MG) BY MOUTH DAILY  . Magnesium 400 MG CAPS Take 400 mg by mouth daily.  . Multiple Vitamin (MULTIVITAMIN) tablet Take 1 tablet by mouth daily.  Marland Kitchen omeprazole (PRILOSEC) 20 MG capsule TAKE 1 CAPSULE DAILY  . spironolactone (ALDACTONE) 25 MG tablet TAKE 1 TABLET(25 MG) BY MOUTH DAILY  . amoxicillin (AMOXIL) 500 MG capsule TK 4 CAPSULES PO 1 HOUR PRIOR TO DENTAL TREATMENT (Patient not taking: Reported on 05/14/2020)  . [DISCONTINUED] diclofenac sodium (VOLTAREN) 1 % GEL APPLY 2 TO 4 GRAMS AA BID PRN (Patient not taking:  Reported on 05/14/2020)   No facility-administered encounter medications on file as of 05/14/2020.    Allergies (verified) Carvedilol, Atenolol, Baclofen, Hydralazine hcl, Oxycodone, Sulfa antibiotics, Adhesive [tape], and Amlodipine   History: Past Medical History:  Diagnosis Date  . Bullous pemphigoid   . Hypertension    Past Surgical History:  Procedure Laterality Date  . APPENDECTOMY  1980  . BREAST BIOPSY  1980  . SPINE SURGERY    . VAGINAL HYSTERECTOMY  1980   Family History  Problem Relation Age of Onset  . Arthritis Other   . High Cholesterol Mother   . Heart disease Mother   . Hypertension Mother    Social History   Socioeconomic History  . Marital status: Widowed    Spouse name: Not on file  . Number of children: 1  . Years of education: Not on file  . Highest education level: Not on file  Occupational History  . Not on file  Tobacco Use  . Smoking status: Never Smoker  . Smokeless tobacco: Never Used  Substance and Sexual Activity  . Alcohol use: No    Alcohol/week: 0.0 standard drinks  . Drug use: No  . Sexual activity: Not on file  Other Topics Concern  . Not on file  Social History Narrative  . Not on file   Social Determinants of Health   Financial Resource Strain: Low  Risk   . Difficulty of Paying Living Expenses: Not hard at all  Food Insecurity: No Food Insecurity  . Worried About Charity fundraiser in the Last Year: Never true  . Ran Out of Food in the Last Year: Never true  Transportation Needs: No Transportation Needs  . Lack of Transportation (Medical): No  . Lack of Transportation (Non-Medical): No  Physical Activity: Inactive  . Days of Exercise per Week: 0 days  . Minutes of Exercise per Session: 0 min  Stress: No Stress Concern Present  . Feeling of Stress : Not at all  Social Connections: Moderately Isolated  . Frequency of Communication with Friends and Family: More than three times a week  . Frequency of Social  Gatherings with Friends and Family: Three times a week  . Attends Religious Services: Never  . Active Member of Clubs or Organizations: Yes  . Attends Archivist Meetings: 1 to 4 times per year  . Marital Status: Widowed    Tobacco Counseling Counseling given: Not Answered   Clinical Intake:  Pre-visit preparation completed: Yes  Pain : 0-10 Pain Score: 4  Pain Type: Chronic pain Pain Location: Back Pain Orientation: Lower Pain Descriptors / Indicators: Cramping Pain Onset: More than a month ago Pain Frequency: Intermittent Pain Relieving Factors: Resting  Pain Relieving Factors: Resting  Nutritional Risks: Nausea/ vomitting/ diarrhea, None (diarrhea) Diabetes: No  How often do you need to have someone help you when you read instructions, pamphlets, or other written materials from your doctor or pharmacy?: 1 - Never What is the last grade level you completed in school?: 2 years of College  Diabetic?No   Interpreter Needed?: No  Information entered by :: [place] of Daily Living In your present state of health, do you have any difficulty performing the following activities: 05/14/2020  Hearing? Y  Comment Has issues with hearing. Has bilateral hearing aids  Vision? N  Difficulty concentrating or making decisions? N  Walking or climbing stairs? Y  Comment gets pain in back with walking and climbing stairs  Dressing or bathing? N  Doing errands, shopping? N  Preparing Food and eating ? N  Using the Toilet? N  In the past six months, have you accidently leaked urine? N  Do you have problems with loss of bowel control? N  Managing your Medications? N  Managing your Finances? N  Housekeeping or managing your Housekeeping? N  Some recent data might be hidden    Patient Care Team: Martinique, Betty G, MD as PCP - General (Family Medicine)  Indicate any recent Medical Services you may have received from other than [facility] providers in the past  year (date may be approximate).     Assessment:   This is a routine wellness examination for [place].  Hearing/Vision screen  Hearing Screening   125Hz  250Hz  500Hz  1000Hz  2000Hz  3000Hz  4000Hz  6000Hz  8000Hz   Right ear:           Left ear:           Vision Screening Comments: Patient states gets eyes checked once per year   Dietary issues and exercise activities discussed: Current Exercise Habits: The patient does not participate in regular exercise at present, Exercise limited by: orthopedic condition(s)  Goals    . Exercise 3x per week (30 min per time)    . patient     Considering moving to residential community;   Continue to explore senior communities     .  Patient Stated     Needing help to do small things in the yard Make a list of what you need done   [employer]; [phone] ( community housing solutions)  [facility]; [phone] Get resource to get information on any and all community programs for [place]; "Aging Gracefully In Place" program; can request or apply  [employer]: [phone] Community Health Response Program -[phone] Public Health Dept; Need to be a skilled visit but can assist with bathing as well; [phone]            . Patient Stated     Will seek to volunteer to help children or adults to read       Depression Screen PHQ 2/9 Scores 05/14/2020 03/12/2019 01/27/2018 08/31/2017 05/20/2016  PHQ - 2 Score 0 0 0 0 0  PHQ- 9 Score 0 - - - -    Fall Risk Fall Risk  05/14/2020 03/12/2019 01/27/2018 12/12/2017 05/20/2016  Falls in the past year? 0 0 Yes Yes No  Number falls in past yr: 0 0 1 1 -  Injury with Fall? 0 0 - Yes -  Risk for fall due to : Orthopedic patient - - - -  Follow up Falls evaluation completed;Falls prevention discussed Education provided Education provided - -    Any stairs in or around the home? No  If so, are there any without handrails? No  Home free of loose throw rugs in walkways, pet  beds, electrical cords, etc? Yes  Adequate lighting in your home to reduce risk of falls? Yes   ASSISTIVE DEVICES UTILIZED TO PREVENT FALLS:  Life alert? No  Use of a cane, walker or w/c? No  Grab bars in the bathroom? No  Shower chair or bench in shower? Yes  Elevated toilet seat or a handicapped toilet? Yes   TIMED UP AND GO:  Was the test performed? Yes .  Length of time to ambulate 10 feet: 5 sec.   Gait steady and fast without use of assistive device  Cognitive Function: MMSE - Mini Mental State Exam 01/27/2018 05/20/2016  Not completed: (No Data) (No Data)     6CIT Screen 05/14/2020  What Year? 0 points  What month? 0 points  What time? 0 points  Count back from 20 0 points  Months in reverse 0 points  Repeat phrase 2 points  Total Score 2    Immunizations Immunization History  Administered Date(s) Administered  . Fluad Quad(high Dose 65+) 04/21/2020  . Influenza, High Dose Seasonal PF 05/20/2016, 05/31/2018  . Influenza-Unspecified 05/20/2016, 05/26/2017  . PFIZER SARS-COV-2 Vaccination 08/25/2019, 09/15/2019, 04/28/2020, 04/28/2020  . Pneumococcal Conjugate-13 08/02/2013  . Pneumococcal Polysaccharide-23 08/02/2008, 07/20/2013  . Tdap 10/07/2016  . Zoster 08/03/2003, 12/09/2016  . Zoster Recombinat (Shingrix) 12/09/2016, 03/21/2017    TDAP status: Up to date Flu Vaccine status: Up to date Pneumococcal vaccine status: Up to date Covid-19 vaccine status: Completed vaccines  Qualifies for Shingles Vaccine? Yes   Zostavax completed Yes   Shingrix Completed?: Yes  Screening Tests Health Maintenance  Topic Date Due  . DEXA SCAN  Never done  . TETANUS/TDAP  10/08/2026  . INFLUENZA VACCINE  Completed  . COVID-19 Vaccine  Completed  . PNA vac Low Risk Adult  Completed    Health Maintenance  Health Maintenance Due  Topic Date Due  . DEXA SCAN  Never done    Colorectal cancer screening: No longer required.  Mammogram status: No longer required.    Bone  Density status: Ordered 05/14/2020. Pt provided with contact info and advised to call to schedule appt.  Lung Cancer Screening: (Low Dose CT Chest recommended if Age 48-80 years, 30 pack-year currently smoking OR have quit w/in 15years.) does not qualify.   Lung Cancer Screening Referral: N/A  Additional Screening:  Hepatitis C Screening: does not qualify;   Vision Screening: Recommended annual ophthalmology exams for early detection of glaucoma and other disorders of the eye. Is the patient up to date with their annual eye exam?  Yes  Who is the provider or what is the name of the office in which the patient attends annual eye exams? Dr. Herbert Deaner If pt is not established with a provider, would they like to be referred to a provider to establish care? No .   Dental Screening: Recommended annual dental exams for proper oral hygiene  Community Resource Referral / Chronic Care Management: CRR required this visit?  No   CCM required this visit?  No      Plan:     I have personally reviewed and noted the following in the patient's chart:   . Medical and social history . Use of alcohol, tobacco or illicit drugs  . Current medications and supplements . Functional ability and status . Nutritional status . Physical activity . Advanced directives . List of other physicians . Hospitalizations, surgeries, and ER visits in previous 12 months . Vitals . Screenings to include cognitive, depression, and falls . Referrals and appointments  In addition, I have reviewed and discussed with patient certain preventive protocols, quality metrics, and best practice recommendations. A written personalized care plan for preventive services as well as general preventive health recommendations were provided to patient.     Ofilia Neas, LPN   [id number]   Nurse Notes: None

## 2020-05-19 ENCOUNTER — Ambulatory Visit (INDEPENDENT_AMBULATORY_CARE_PROVIDER_SITE_OTHER): Payer: Medicare Other | Admitting: Family Medicine

## 2020-05-19 ENCOUNTER — Other Ambulatory Visit: Payer: Self-pay

## 2020-05-19 ENCOUNTER — Encounter: Payer: Self-pay | Admitting: Family Medicine

## 2020-05-19 VITALS — BP 128/68 | HR 68 | Temp 97.7°F | Resp 16 | Ht 61.0 in | Wt 129.4 lb

## 2020-05-19 DIAGNOSIS — E7800 Pure hypercholesterolemia, unspecified: Secondary | ICD-10-CM | POA: Diagnosis not present

## 2020-05-19 DIAGNOSIS — E782 Mixed hyperlipidemia: Secondary | ICD-10-CM

## 2020-05-19 DIAGNOSIS — I10 Essential (primary) hypertension: Secondary | ICD-10-CM | POA: Diagnosis not present

## 2020-05-19 DIAGNOSIS — N1832 Chronic kidney disease, stage 3b: Secondary | ICD-10-CM | POA: Diagnosis not present

## 2020-05-19 DIAGNOSIS — R197 Diarrhea, unspecified: Secondary | ICD-10-CM

## 2020-05-19 DIAGNOSIS — G4700 Insomnia, unspecified: Secondary | ICD-10-CM | POA: Diagnosis not present

## 2020-05-19 DIAGNOSIS — I700 Atherosclerosis of aorta: Secondary | ICD-10-CM | POA: Insufficient documentation

## 2020-05-19 MED ORDER — ATORVASTATIN CALCIUM 40 MG PO TABS: 20.0000 mg | ORAL_TABLET | Freq: Every day | ORAL | 0 refills | Status: DC

## 2020-05-19 MED ORDER — SPIRONOLACTONE 25 MG PO TABS: 12.5000 mg | ORAL_TABLET | Freq: Every day | ORAL | 1 refills | Status: DC

## 2020-05-19 NOTE — Patient Instructions (Addendum)
A few things to remember from today's visit:  Essential hypertension - Plan: BASIC METABOLIC PANEL WITH GFR  Stage 3b chronic kidney disease ([place]) - Plan: BASIC METABOLIC PANEL WITH GFR  Hypercholesteremia  Mixed hyperlipidemia - Plan: atorvastatin (LIPITOR) 40 MG tablet  Diarrhea, unspecified type - Plan: TSH  Insomnia, unspecified type  Adequately hydrated. Today Spironolactone and Atorvastatin decreased to 1/2 tab. No changes in rest of your meds. Fall precautions.  We will plan on checking your cholesterol next visit. Handicap sticker given today.  If you need refills please call your pharmacy. Do not use My Chart to request refills or for acute issues that need immediate attention.    Please be sure medication list is accurate. If a new problem present, please set up appointment sooner than planned today.

## 2020-05-19 NOTE — Assessment & Plan Note (Addendum)
She would like to try decreasing statin. Atorvastatin decreased from 40 mg tp 20 mg (1/2 tab). Will check FLP next visit.

## 2020-05-19 NOTE — Progress Notes (Signed)
HPI: Amber Baird is a 85 y.o. female, who is here today for 6 months follow up.   She was last seen on 10/24/19. Since her last visit she underwent back surgery, which helped some. States that she found out to be allergic to Oxycodone (delirium) and baclofen. She has also seen psychologies due to mood disorder. She is not reporting any psychiatirc problem today.  She does not check BP regularly. BP during OV's have been in the low 100's/60's.  Negative for severe/frequent headache, visual changes, chest pain, dyspnea, palpitation,focal weakness, or edema. She is on Lisinopril 30 mg daily,spironolactone 25 mg daily,and HCTZ 25 mg daily. Tolerating medications well.  Lab Results  Component Value Date   CREATININE 1.23 (H) 03/12/2019   BUN 25 (H) 03/12/2019   NA 134 (L) 03/12/2019   K 4.5 03/12/2019   CL 98 03/12/2019   CO2 25 03/12/2019   She is still having episodes of diarrhea, although improved after some medications were discontinued. She had diarrhea when she was on higher dose of Mg supplementation and with Carvedilol.  Diarrhea once per week,stool incontinence,there is "no warning." She has no associated abdominal pain,N/V,urinary symptoms,melena,or blood in stool. She has not identified exacerbating or alleviating factors.  Abdominal CTA in 01/2016: 1. Negative for renal artery stenosis or other vascular lesion to suggest a renovascular component of hypertension. 2. No evidence of pheochromocytoma. 3. Moderate hiatal hernia. 4.  Aortic Atherosclerosis (ICD10-170.0)  She thinks it is caused by one of the meds she is taking. She is on Mg 250 mg daily and multivitamins.  No dietary changes or new medications. She has been on Atorvastatin 40 mg. She has taken statin for a few years, already had diarrhea when she started. Lab Results  Component Value Date   CHOL 173 09/04/2018   HDL 57.90 09/04/2018   LDLCALC 87 09/04/2018   TRIG 140.0 09/04/2018    CHOLHDL 3 09/04/2018   Takes Omeprazole 20 mg daily for GERD. She has tolerated medication well. PPI still helping.  Insomnia: She is on Gabapentin 300 mg at bedtime. She sleeps about 5-6 hours. She feels rested.  Her sister is staying with her after complications from a fall.  Lab Results  Component Value Date   CHOL 173 09/04/2018   HDL 57.90 09/04/2018   LDLCALC 87 09/04/2018   TRIG 140.0 09/04/2018   CHOLHDL 3 09/04/2018   Lab Results  Component Value Date   TSH 2.27 03/01/2017   She also got her COIVD  19 booster.  Review of Systems  Constitutional: Negative for activity change, appetite change, fatigue and fever.  HENT: Negative for mouth sores, nosebleeds and trouble swallowing.   Respiratory: Negative for cough and wheezing.   Genitourinary: Negative for decreased urine volume, dysuria and hematuria.  Musculoskeletal: Positive for back pain. Negative for gait problem.  Skin: Negative for pallor and rash.  Neurological: Negative for syncope, facial asymmetry, weakness and numbness.  Psychiatric/Behavioral: Negative for confusion. The patient is not nervous/anxious.   Rest of ROS, see pertinent positives sand negatives in HPI  Current Outpatient Medications on File Prior to Visit  Medication Sig Dispense Refill  . Biotin (BIOTIN 5000) 5 MG CAPS Take 1 capsule by mouth daily.    Marland Kitchen gabapentin (NEURONTIN) 300 MG capsule TAKE 1 CAPSULE(300 MG) BY MOUTH EVERY EVENING 90 capsule 1  . hydrochlorothiazide (HYDRODIURIL) 25 MG tablet TAKE 1 TABLET DAILY 90 tablet 3  . lisinopril (ZESTRIL) 30 MG tablet TAKE 1 TABLET(30  MG) BY MOUTH DAILY 90 tablet 2  . Multiple Vitamin (MULTIVITAMIN) tablet Take 1 tablet by mouth daily.    Marland Kitchen omeprazole (PRILOSEC) 20 MG capsule TAKE 1 CAPSULE DAILY 90 capsule 3  . Magnesium 400 MG CAPS Take 400 mg by mouth daily.     No current facility-administered medications on file prior to visit.   Past Medical History:  Diagnosis Date  . Bullous  pemphigoid   . Hypertension    Allergies  Allergen Reactions  . Carvedilol Diarrhea  . Atenolol Other (See Comments)    Drops heart rate  . Baclofen Other (See Comments)    Stroke like symptoms   . Hydralazine Hcl Swelling and Other (See Comments)    Swelling on face, arms red from elbow to fingers.  . Oxycodone Other (See Comments)    Confusion   . Sulfa Antibiotics Other (See Comments)    Childhood allergy   . Adhesive [Tape] Rash  . Amlodipine Rash and Other (See Comments)    flushing   Social History   Socioeconomic History  . Marital status: Widowed    Spouse name: Not on file  . Number of children: 1  . Years of education: Not on file  . Highest education level: Not on file  Occupational History  . Not on file  Tobacco Use  . Smoking status: Never Smoker  . Smokeless tobacco: Never Used  Substance and Sexual Activity  . Alcohol use: No    Alcohol/week: 0.0 standard drinks  . Drug use: No  . Sexual activity: Not on file  Other Topics Concern  . Not on file  Social History Narrative  . Not on file   Social Determinants of Health   Financial Resource Strain: Low Risk   . Difficulty of Paying Living Expenses: Not hard at all  Food Insecurity: No Food Insecurity  . Worried About Charity fundraiser in the Last Year: Never true  . Ran Out of Food in the Last Year: Never true  Transportation Needs: No Transportation Needs  . Lack of Transportation (Medical): No  . Lack of Transportation (Non-Medical): No  Physical Activity: Inactive  . Days of Exercise per Week: 0 days  . Minutes of Exercise per Session: 0 min  Stress: No Stress Concern Present  . Feeling of Stress : Not at all  Social Connections: Moderately Isolated  . Frequency of Communication with Friends and Family: More than three times a week  . Frequency of Social Gatherings with Friends and Family: Three times a week  . Attends Religious Services: Never  . Active Member of Clubs or Organizations:  Yes  . Attends Archivist Meetings: 1 to 4 times per year  . Marital Status: Widowed   Vitals:   05/19/20 1138  BP: 128/68  Pulse: 68  Resp: 16  Temp: 97.7 F (36.5 C)   Body mass index is 24.45 kg/m.  Physical Exam Vitals and nursing note reviewed.  Constitutional:      General: She is not in acute distress.    Appearance: She is well-developed and normal weight.  HENT:     Head: Normocephalic and atraumatic.     Mouth/Throat:     Mouth: Mucous membranes are moist.     Pharynx: Oropharynx is clear.  Eyes:     Conjunctiva/sclera: Conjunctivae normal.     Pupils: Pupils are equal, round, and reactive to light.  Cardiovascular:     Rate and Rhythm: Normal rate and regular rhythm.  Heart sounds: Murmur (Soft SEM RUSB) heard.      Comments: DP pulses present. Pulmonary:     Effort: Pulmonary effort is normal. No respiratory distress.     Breath sounds: Normal breath sounds.  Abdominal:     Palpations: Abdomen is soft. There is no hepatomegaly or mass.     Tenderness: There is no abdominal tenderness.  Lymphadenopathy:     Cervical: No cervical adenopathy.  Skin:    General: Skin is warm.     Findings: No erythema or rash.  Neurological:     General: No focal deficit present.     Mental Status: She is alert and oriented to person, place, and time.     Cranial Nerves: No cranial nerve deficit.     Comments: Stable gait, not assisted.  Psychiatric:        Mood and Affect: Mood and affect normal.     Comments: Well groomed, good eye contact.   ASSESSMENT AND PLAN:  Amber Baird was seen today for 6 months follow-up.  Orders Placed This Encounter  Procedures  . BASIC METABOLIC PANEL WITH GFR  . TSH   Lab Results  Component Value Date   TSH 1.95 05/19/2020   Lab Results  Component Value Date   CREATININE 1.43 (H) 05/19/2020   BUN 33 (H) 05/19/2020   NA 136 05/19/2020   K 4.9 05/19/2020   CL 102 05/19/2020   CO2 23 05/19/2020    Atherosclerosis of aorta ([place]) Reported on abdominal CTA in 2017. Continue Atorvastatin.  - atorvastatin (LIPITOR) 40 MG tablet; Take 0.5 tablets (20 mg total) by mouth daily.  Dispense: 90 tablet; Refill: 0  Diarrhea, unspecified type This seems to be a chronic problem. ? IBS. Adequate hydration. Some medication disese decreased, she will monitor for changes. Instructed about warning signs.  Essential hypertension BP adequately controlled. Some readings on lower normal range. Spironolactone decreased from 25 mg to 12.5 mg (1/2). Continue Lisinopril 30 mg and HCTZ 25 mg daily.  Mixed hyperlipidemia She would like to try decreasing statin. Atorvastatin decreased from 40 mg tp 20 mg (1/2 tab). Will check FLP next visit.  CKD (chronic kidney disease), stage III Problem has been stable. Continue Lisinopril. Adequate BP controlled and hydration. Low salt diet and avoidance of NSAID's to continue.  Insomnia Stable. Continue Gabapentin 300 mg at bedtime. Good sleep hygiene.  Spent 41 minutes with pt.  During this time history was obtained and documented, examination was performed, prior labs/imaging reviewed, and assessment/plan discussion.  Return in about 4 months (around 09/19/2020) for HLD,HTN.   Diany Formosa G. Martinique, MD  [facility]. [place] office.   A few things to remember from today's visit:  Essential hypertension - Plan: BASIC METABOLIC PANEL WITH GFR  Stage 3b chronic kidney disease ([place]) - Plan: BASIC METABOLIC PANEL WITH GFR  Hypercholesteremia  Mixed hyperlipidemia - Plan: atorvastatin (LIPITOR) 40 MG tablet  Diarrhea, unspecified type - Plan: TSH  Insomnia, unspecified type  Adequately hydrated. Today Spironolactone and Atorvastatin decreased to 1/2 tab. No changes in rest of your meds. Fall precautions.  We will plan on checking your cholesterol next visit. Handicap sticker given today.  If you need refills please call your  pharmacy. Do not use My Chart to request refills or for acute issues that need immediate attention.    Please be sure medication list is accurate. If a new problem present, please set up appointment sooner than planned today.

## 2020-05-19 NOTE — Assessment & Plan Note (Addendum)
Problem has been stable. Continue Lisinopril. Adequate BP controlled and hydration. Low salt diet and avoidance of NSAID's to continue.

## 2020-05-19 NOTE — Assessment & Plan Note (Signed)
Stable. Continue Gabapentin 300 mg at bedtime. Good sleep hygiene.

## 2020-05-19 NOTE — Assessment & Plan Note (Addendum)
BP adequately controlled. Some readings on lower normal range. Spironolactone decreased from 25 mg to 12.5 mg (1/2). Continue Lisinopril 30 mg and HCTZ 25 mg daily.

## 2020-05-20 LAB — BASIC METABOLIC PANEL WITH GFR
BUN/Creatinine Ratio: 23 (calc) — ABNORMAL HIGH (ref 6–22)
BUN: 33 mg/dL — ABNORMAL HIGH (ref 7–25)
CO2: 23 mmol/L (ref 20–32)
Calcium: 10.6 mg/dL — ABNORMAL HIGH (ref 8.6–10.4)
Chloride: 102 mmol/L (ref 98–110)
Creat: 1.43 mg/dL — ABNORMAL HIGH (ref 0.60–0.88)
GFR, Est African American: 39 mL/min/{1.73_m2} — ABNORMAL LOW (ref >=60)
GFR, Est Non African American: 34 mL/min/{1.73_m2} — ABNORMAL LOW (ref >=60)
Glucose, Bld: 74 mg/dL (ref 65–99)
Potassium: 4.9 mmol/L (ref 3.5–5.3)
Sodium: 136 mmol/L (ref 135–146)

## 2020-05-20 LAB — TSH: TSH: 1.95 mIU/L (ref 0.40–4.50)

## 2020-05-22 ENCOUNTER — Encounter: Payer: Self-pay | Admitting: Family Medicine

## 2020-05-22 NOTE — Telephone Encounter (Signed)
error 

## 2020-06-26 ENCOUNTER — Other Ambulatory Visit: Payer: Self-pay | Admitting: Family Medicine

## 2020-06-26 DIAGNOSIS — E7800 Pure hypercholesterolemia, unspecified: Secondary | ICD-10-CM

## 2020-07-15 DIAGNOSIS — Z85828 Personal history of other malignant neoplasm of skin: Secondary | ICD-10-CM | POA: Diagnosis not present

## 2020-07-15 DIAGNOSIS — D485 Neoplasm of uncertain behavior of skin: Secondary | ICD-10-CM | POA: Diagnosis not present

## 2020-07-15 DIAGNOSIS — L821 Other seborrheic keratosis: Secondary | ICD-10-CM | POA: Diagnosis not present

## 2020-07-15 DIAGNOSIS — L820 Inflamed seborrheic keratosis: Secondary | ICD-10-CM | POA: Diagnosis not present

## 2020-07-15 DIAGNOSIS — L570 Actinic keratosis: Secondary | ICD-10-CM | POA: Diagnosis not present

## 2020-07-15 DIAGNOSIS — D0439 Carcinoma in situ of skin of other parts of face: Secondary | ICD-10-CM | POA: Diagnosis not present

## 2020-07-28 ENCOUNTER — Other Ambulatory Visit: Payer: Self-pay | Admitting: Family Medicine

## 2020-07-28 DIAGNOSIS — I10 Essential (primary) hypertension: Secondary | ICD-10-CM

## 2020-07-29 ENCOUNTER — Other Ambulatory Visit: Payer: Self-pay | Admitting: Family Medicine

## 2020-07-29 DIAGNOSIS — G4700 Insomnia, unspecified: Secondary | ICD-10-CM

## 2020-08-06 DIAGNOSIS — Z85828 Personal history of other malignant neoplasm of skin: Secondary | ICD-10-CM | POA: Diagnosis not present

## 2020-08-06 DIAGNOSIS — C44329 Squamous cell carcinoma of skin of other parts of face: Secondary | ICD-10-CM | POA: Diagnosis not present

## 2020-08-13 DIAGNOSIS — D485 Neoplasm of uncertain behavior of skin: Secondary | ICD-10-CM | POA: Diagnosis not present

## 2020-08-13 DIAGNOSIS — C44311 Basal cell carcinoma of skin of nose: Secondary | ICD-10-CM | POA: Diagnosis not present

## 2020-08-18 ENCOUNTER — Other Ambulatory Visit: Payer: Self-pay | Admitting: Family Medicine

## 2020-08-18 DIAGNOSIS — I10 Essential (primary) hypertension: Secondary | ICD-10-CM

## 2020-08-28 ENCOUNTER — Other Ambulatory Visit: Payer: Self-pay | Admitting: Family Medicine

## 2020-08-28 DIAGNOSIS — Z85828 Personal history of other malignant neoplasm of skin: Secondary | ICD-10-CM | POA: Diagnosis not present

## 2020-08-28 DIAGNOSIS — C4401 Basal cell carcinoma of skin of lip: Secondary | ICD-10-CM | POA: Diagnosis not present

## 2020-08-28 NOTE — Telephone Encounter (Signed)
Pt is calling in stating that she needs a refill on Rx spironolactone (ALDACTONE) 25 MG  Pharm:  [facility] in [place]

## 2020-11-06 ENCOUNTER — Other Ambulatory Visit: Payer: Self-pay | Admitting: Family Medicine

## 2020-11-06 DIAGNOSIS — E2839 Other primary ovarian failure: Secondary | ICD-10-CM

## 2020-11-10 ENCOUNTER — Other Ambulatory Visit: Payer: Medicare Other

## 2020-11-18 ENCOUNTER — Ambulatory Visit (INDEPENDENT_AMBULATORY_CARE_PROVIDER_SITE_OTHER): Payer: Medicare Other | Admitting: Family Medicine

## 2020-11-18 ENCOUNTER — Other Ambulatory Visit: Payer: Self-pay | Admitting: Family Medicine

## 2020-11-18 ENCOUNTER — Encounter: Payer: Self-pay | Admitting: Family Medicine

## 2020-11-18 ENCOUNTER — Other Ambulatory Visit: Payer: Self-pay

## 2020-11-18 VITALS — BP 122/68 | HR 87 | Temp 97.7°F | Resp 12 | Ht 61.0 in | Wt 124.6 lb

## 2020-11-18 DIAGNOSIS — E7800 Pure hypercholesterolemia, unspecified: Secondary | ICD-10-CM | POA: Diagnosis not present

## 2020-11-18 DIAGNOSIS — I10 Essential (primary) hypertension: Secondary | ICD-10-CM | POA: Diagnosis not present

## 2020-11-18 DIAGNOSIS — R221 Localized swelling, mass and lump, neck: Secondary | ICD-10-CM

## 2020-11-18 DIAGNOSIS — I700 Atherosclerosis of aorta: Secondary | ICD-10-CM | POA: Diagnosis not present

## 2020-11-18 DIAGNOSIS — N1832 Chronic kidney disease, stage 3b: Secondary | ICD-10-CM

## 2020-11-18 LAB — CBC WITH DIFFERENTIAL/PLATELET
Basophils Absolute: 0.1 10*3/uL (ref 0.0–0.1)
Basophils Relative: 0.7 % (ref 0.0–3.0)
Eosinophils Absolute: 0.1 10*3/uL (ref 0.0–0.7)
Eosinophils Relative: 0.9 % (ref 0.0–5.0)
HCT: 40.1 % (ref 36.0–46.0)
Hemoglobin: 13.5 g/dL (ref 12.0–15.0)
Lymphocytes Relative: 23.5 % (ref 12.0–46.0)
Lymphs Abs: 1.8 10*3/uL (ref 0.7–4.0)
MCHC: 33.7 g/dL (ref 30.0–36.0)
MCV: 94.1 fl (ref 78.0–100.0)
Monocytes Absolute: 0.7 10*3/uL (ref 0.1–1.0)
Monocytes Relative: 8.6 % (ref 3.0–12.0)
Neutro Abs: 5.2 10*3/uL (ref 1.4–7.7)
Neutrophils Relative %: 66.3 % (ref 43.0–77.0)
Platelets: 267 10*3/uL (ref 150.0–400.0)
RBC: 4.26 Mil/uL (ref 3.87–5.11)
RDW: 12.9 % (ref 11.5–15.5)
WBC: 7.8 10*3/uL (ref 4.0–10.5)

## 2020-11-18 LAB — LIPID PANEL
Cholesterol: 164 mg/dL (ref 0–200)
HDL: 54.7 mg/dL (ref >39.00)
LDL Cholesterol: 81 mg/dL (ref 0–99)
NonHDL: 109.02
Total CHOL/HDL Ratio: 3
Triglycerides: 139 mg/dL (ref 0.0–149.0)
VLDL: 27.8 mg/dL (ref 0.0–40.0)

## 2020-11-18 LAB — BASIC METABOLIC PANEL
BUN: 32 mg/dL — ABNORMAL HIGH (ref 6–23)
CO2: 25 mEq/L (ref 19–32)
Calcium: 9.9 mg/dL (ref 8.4–10.5)
Chloride: 100 mEq/L (ref 96–112)
Creatinine, Ser: 1.21 mg/dL — ABNORMAL HIGH (ref 0.40–1.20)
GFR: 41.24 mL/min — ABNORMAL LOW (ref >60.00)
Glucose, Bld: 79 mg/dL (ref 70–99)
Potassium: 4.4 mEq/L (ref 3.5–5.1)
Sodium: 136 mEq/L (ref 135–145)

## 2020-11-18 LAB — PHOSPHORUS: Phosphorus: 3.7 mg/dL (ref 2.3–4.6)

## 2020-11-18 LAB — VITAMIN D 25 HYDROXY (VIT D DEFICIENCY, FRACTURES): VITD: 40.08 ng/mL (ref 30.00–100.00)

## 2020-11-18 LAB — MICROALBUMIN / CREATININE URINE RATIO
Creatinine,U: 44.2 mg/dL
Microalb Creat Ratio: 1.6 mg/g (ref 0.0–30.0)
Microalb, Ur: <0.7 mg/dL (ref 0.0–1.9)

## 2020-11-18 LAB — MAGNESIUM: Magnesium: 1.8 mg/dL (ref 1.5–2.5)

## 2020-11-18 NOTE — Progress Notes (Addendum)
Chief Complaint  Patient presents with  . Mass  . Follow-up   HPI: [name] is a 86 y.o. female, who is here today complaining of "lump" under jaw line, noted 2-3 days ago. She felt something "funny" in area when she was turning her head, so she palpated and noted abnormality. No hx of trauma. She has not noted pain or skin changes. It seems to be stable. Negative for fever,chills,unusual fatigue,sore throat,dysphagia,oral lesions,dental problems,or cough.  She would like to have labs done today, she is due for follow up. HypoMg: She is on Mg++ supplementation 350 mg daily, tolerating well. Higher doses have caused diarrhea.  Last Mg++ 1.8 in 03/2019. HyperCa"": Ca"" mildly elevated at 10.6.  HTN: She tried decreasing Spironolactone to 12.5 mg as instructed but BP went up, so she increased dose back to 25 mg daily. She is also on Lisinopril 30 mg daily and HCTZ 25 mg daily.  Negative for severe/frequent headache, visual changes, chest pain, dyspnea, palpitation, focal weakness, or edema. CKD III: She has not noted gross hematuria, foam in urine,or decreased urine output.  HFpEF: Echo in 01/2016: LVEF 18-29%, grade I diastolic dysfunction.  Lab Results  Component Value Date   CREATININE 1.43 (H) 05/19/2020   BUN 33 (H) 05/19/2020   NA 136 05/19/2020   K 4.9 05/19/2020   CL 102 05/19/2020   CO2 23 05/19/2020   HLD: She is on  Atorvastatin 40 mg daily. Tolerating medication well. Aortic atherosclerosis seen on imaging.  Lab Results  Component Value Date   CHOL 173 09/04/2018   HDL 57.90 09/04/2018   LDLCALC 87 09/04/2018   TRIG 140.0 09/04/2018   CHOLHDL 3 09/04/2018   Review of Systems  Constitutional: Negative for activity change and appetite change.  HENT: Negative for nosebleeds and sinus pain.   Respiratory: Negative for wheezing.   Gastrointestinal: Negative for abdominal pain, nausea and vomiting.       Negative for changes in bowel habits.   Skin: Negative for pallor and rash.  Neurological: Negative for syncope, facial asymmetry and weakness.  Psychiatric/Behavioral: Negative for confusion and hallucinations.  Rest see pertinent positives and negatives per HPI.  Current Outpatient Medications on File Prior to Visit  Medication Sig Dispense Refill  . atorvastatin (LIPITOR) 40 MG tablet TAKE 1 TABLET DAILY 90 tablet 3  . Biotin (BIOTIN 5000) 5 MG CAPS Take 1 capsule by mouth daily.    Marland Kitchen gabapentin (NEURONTIN) 300 MG capsule TAKE 1 CAPSULE(300 MG) BY MOUTH EVERY EVENING 90 capsule 1  . hydrochlorothiazide (HYDRODIURIL) 25 MG tablet TAKE 1 TABLET DAILY 90 tablet 3  . lisinopril (ZESTRIL) 30 MG tablet TAKE 1 TABLET(30 MG) BY MOUTH DAILY 90 tablet 2  . Magnesium 400 MG CAPS Take 400 mg by mouth daily.    . Multiple Vitamin (MULTIVITAMIN) tablet Take 1 tablet by mouth daily.    Marland Kitchen omeprazole (PRILOSEC) 20 MG capsule TAKE 1 CAPSULE DAILY 90 capsule 3  . spironolactone (ALDACTONE) 25 MG tablet TAKE 1 TABLET(25 MG) BY MOUTH DAILY 90 tablet 1   No current facility-administered medications on file prior to visit.   Past Medical History:  Diagnosis Date  . Bullous pemphigoid   . Hypertension    Allergies  Allergen Reactions  . Carvedilol Diarrhea  . Atenolol Other (See Comments)    Drops heart rate  . Baclofen Other (See Comments)    Stroke like symptoms   . Hydralazine Hcl Swelling and Other (See Comments)  Swelling on face, arms red from elbow to fingers.  . Oxycodone Other (See Comments)    Confusion   . Sulfa Antibiotics Other (See Comments)    Childhood allergy   . Adhesive [Tape] Rash  . Amlodipine Rash and Other (See Comments)    flushing    Social History   Socioeconomic History  . Marital status: Widowed    Spouse name: Not on file  . Number of children: 1  . Years of education: Not on file  . Highest education level: Not on file  Occupational History  . Not on file  Tobacco Use  . Smoking status:  Never Smoker  . Smokeless tobacco: Never Used  Substance and Sexual Activity  . Alcohol use: No    Alcohol/week: 0.0 standard drinks  . Drug use: No  . Sexual activity: Not on file  Other Topics Concern  . Not on file  Social History Narrative  . Not on file   Social Determinants of Health   Financial Resource Strain: Low Risk   . Difficulty of Paying Living Expenses: Not hard at all  Food Insecurity: No Food Insecurity  . Worried About Charity fundraiser in the Last Year: Never true  . Ran Out of Food in the Last Year: Never true  Transportation Needs: No Transportation Needs  . Lack of Transportation (Medical): No  . Lack of Transportation (Non-Medical): No  Physical Activity: Inactive  . Days of Exercise per Week: 0 days  . Minutes of Exercise per Session: 0 min  Stress: No Stress Concern Present  . Feeling of Stress : Not at all  Social Connections: Moderately Isolated  . Frequency of Communication with Friends and Family: More than three times a week  . Frequency of Social Gatherings with Friends and Family: Three times a week  . Attends Religious Services: Never  . Active Member of Clubs or Organizations: Yes  . Attends Archivist Meetings: 1 to 4 times per year  . Marital Status: Widowed    Vitals:   11/18/20 1148  BP: 122/68  Pulse: 87  Resp: 12  Temp: 97.7 F (36.5 C)  SpO2: 100%   Body mass index is 23.54 kg/m.  Physical Exam Vitals and nursing note reviewed.  Constitutional:      General: She is not in acute distress.    Appearance: She is well-developed.  HENT:     Head: Normocephalic and atraumatic.     Mouth/Throat:     Mouth: Mucous membranes are moist.     Dentition: No dental tenderness or gum lesions.     Tongue: No lesions.     Palate: No lesions.     Pharynx: Oropharynx is clear.  Eyes:     Conjunctiva/sclera: Conjunctivae normal.  Neck:   Cardiovascular:     Rate and Rhythm: Normal rate and regular rhythm.     Pulses:           Dorsalis pedis pulses are 2+ on the right side and 2+ on the left side.     Heart sounds: Murmur (SEM I/VI RUSB) heard.    Pulmonary:     Effort: Pulmonary effort is normal. No respiratory distress.     Breath sounds: Normal breath sounds.  Abdominal:     Palpations: Abdomen is soft. There is no hepatomegaly or mass.     Tenderness: There is no abdominal tenderness.  Lymphadenopathy:     Head:     Right side of head: Submental  adenopathy present. No submandibular adenopathy.     Left side of head: No submental adenopathy.     Cervical: No cervical adenopathy.  Skin:    General: Skin is warm.     Findings: No erythema or rash.  Neurological:     Mental Status: She is alert and oriented to person, place, and time.     Cranial Nerves: No cranial nerve deficit.     Gait: Gait normal.  Psychiatric:     Comments: Well groomed, good eye contact.    ASSESSMENT AND PLAN: [name] was seen today for mass and follow-up.  Diagnoses and all orders for this visit: Orders Placed This Encounter  Procedures  . VITAMIN D 25 Hydroxy (Vit-D Deficiency, Fractures)  . Parathyroid hormone, intact (no Ca)  . Calcium, ionized  . Basic metabolic panel  . Microalbumin / creatinine urine ratio  . Phosphorus  . Magnesium  . Lipid panel  . CBC with Differential/Platelet      Lab Results  Component Value Date   CREATININE 1.21 (H) 11/18/2020   BUN 32 (H) 11/18/2020   NA 136 11/18/2020   K 4.4 11/18/2020   CL 100 11/18/2020   CO2 25 11/18/2020   Lab Results  Component Value Date   MICROALBUR <0.7 11/18/2020   MICROALBUR <0.7 03/12/2019   Lab Results  Component Value Date   CHOL 164 11/18/2020   HDL 54.70 11/18/2020   LDLCALC 81 11/18/2020   TRIG 139.0 11/18/2020   CHOLHDL 3 11/18/2020   Lab Results  Component Value Date   PTH 29 11/18/2020        CALCIUM 9.9 11/18/2020   CAION 5.19 11/18/2020   PHOS 3.7 11/18/2020   Lab Results  Component Value Date   WBC 7.8  11/18/2020   HGB 13.5 11/18/2020   HCT 40.1 11/18/2020   MCV 94.1 11/18/2020   PLT 267.0 11/18/2020   Submental mass We discussed possible etiologies, malignancy to be considered. She prefers to continue monitoring for changes and to hold on imagining for now.She will let me lnow if she changes her mind. Monitor for new associated symptoms.  Hypercalcemia Mild. Possible etiologies discussed. Further recommendations according to lab results.  Stage 3b chronic kidney disease ([place]) Problem has been stable, Cr 1.2-1.4, e GFR high 30's-low 40's. Adequate BP controlled and hydration. Low salt diet and avoidance of NSAID's to continue.  Essential hypertension BP adequately controlled. No changes in HCTZ,spironolactone,or Lisinopril dose. Low salt diet.  Hypercholesteremia Continue Atorvastatin 40 mg daily. Further recommendations according to FLP result.  Hypomagnesemia Continue Mg++ supplementation 350 mg daily.  Atherosclerosis of aorta ([place]) Seeing on imagine. Continue Atorvastatin 40 mg daily.    Return in about 6 months (around 05/20/2021).   Amber Baird G. Martinique, MD  [facility]. [place] office.  A few things to remember from today's visit:   Submental mass - Plan: CBC with Differential/Platelet  Essential hypertension  Hypercholesteremia - Plan: Lipid panel  Stage 3b chronic kidney disease ([place]) - Plan: Basic metabolic panel, Microalbumin / creatinine urine ratio, Phosphorus  Hypomagnesemia - Plan: Magnesium  Hypercalcemia - Plan: VITAMIN D 25 Hydroxy (Vit-D Deficiency, Fractures), Parathyroid hormone, intact (no Ca), Calcium, ionized  If you need refills please call your pharmacy. Do not use My Chart to request refills or for acute issues that need immediate attention.   No changes in medications today. If you decide about neck imaging let me know, otherwise monitor for changes in the next 3 weeks let me  know if still present.  Please be sure  medication list is accurate. If a new problem present, please set up appointment sooner than planned today.

## 2020-11-18 NOTE — Patient Instructions (Signed)
A few things to remember from today's visit:   Submental mass - Plan: CBC with Differential/Platelet  Essential hypertension  Hypercholesteremia - Plan: Lipid panel  Stage 3b chronic kidney disease ([place]) - Plan: Basic metabolic panel, Microalbumin / creatinine urine ratio, Phosphorus  Hypomagnesemia - Plan: Magnesium  Hypercalcemia - Plan: VITAMIN D 25 Hydroxy (Vit-D Deficiency, Fractures), Parathyroid hormone, intact (no Ca), Calcium, ionized  If you need refills please call your pharmacy. Do not use My Chart to request refills or for acute issues that need immediate attention.   No changes in medications today. If you decide about neck imaging let me know, otherwise monitor for changes in the next 3 weeks let me know if still present.  Please be sure medication list is accurate. If a new problem present, please set up appointment sooner than planned today.

## 2020-11-20 ENCOUNTER — Encounter: Payer: Self-pay | Admitting: Family Medicine

## 2020-11-20 LAB — EXTRA SPECIMEN

## 2020-11-20 LAB — PARATHYROID HORMONE, INTACT (NO CA): PTH: 29 pg/mL (ref 16–77)

## 2020-11-20 LAB — HOUSE ACCOUNT TRACKING

## 2020-11-20 LAB — CALCIUM, IONIZED: Calcium, Ion: 5.19 mg/dL (ref 4.8–5.6)

## 2020-12-24 ENCOUNTER — Other Ambulatory Visit: Payer: Self-pay | Admitting: Family Medicine

## 2020-12-24 DIAGNOSIS — K219 Gastro-esophageal reflux disease without esophagitis: Secondary | ICD-10-CM

## 2021-01-02 DIAGNOSIS — N1832 Chronic kidney disease, stage 3b: Secondary | ICD-10-CM | POA: Diagnosis not present

## 2021-01-02 DIAGNOSIS — E785 Hyperlipidemia, unspecified: Secondary | ICD-10-CM | POA: Diagnosis not present

## 2021-01-02 DIAGNOSIS — N390 Urinary tract infection, site not specified: Secondary | ICD-10-CM | POA: Diagnosis not present

## 2021-01-02 DIAGNOSIS — I129 Hypertensive chronic kidney disease with stage 1 through stage 4 chronic kidney disease, or unspecified chronic kidney disease: Secondary | ICD-10-CM | POA: Diagnosis not present

## 2021-01-27 ENCOUNTER — Other Ambulatory Visit: Payer: Self-pay | Admitting: Family Medicine

## 2021-01-27 DIAGNOSIS — G4700 Insomnia, unspecified: Secondary | ICD-10-CM

## 2021-02-20 ENCOUNTER — Other Ambulatory Visit: Payer: Self-pay | Admitting: Family Medicine

## 2021-03-04 DIAGNOSIS — D485 Neoplasm of uncertain behavior of skin: Secondary | ICD-10-CM | POA: Diagnosis not present

## 2021-03-04 DIAGNOSIS — L720 Epidermal cyst: Secondary | ICD-10-CM | POA: Diagnosis not present

## 2021-03-04 DIAGNOSIS — C44719 Basal cell carcinoma of skin of left lower limb, including hip: Secondary | ICD-10-CM | POA: Diagnosis not present

## 2021-03-04 DIAGNOSIS — Z85828 Personal history of other malignant neoplasm of skin: Secondary | ICD-10-CM | POA: Diagnosis not present

## 2021-05-05 DIAGNOSIS — M5416 Radiculopathy, lumbar region: Secondary | ICD-10-CM | POA: Diagnosis not present

## 2021-05-05 DIAGNOSIS — M545 Low back pain, unspecified: Secondary | ICD-10-CM | POA: Diagnosis not present

## 2021-05-05 DIAGNOSIS — G894 Chronic pain syndrome: Secondary | ICD-10-CM | POA: Diagnosis not present

## 2021-05-11 DIAGNOSIS — G894 Chronic pain syndrome: Secondary | ICD-10-CM | POA: Diagnosis not present

## 2021-05-11 DIAGNOSIS — M5416 Radiculopathy, lumbar region: Secondary | ICD-10-CM | POA: Diagnosis not present

## 2021-05-14 ENCOUNTER — Other Ambulatory Visit: Payer: Self-pay | Admitting: Rehabilitation

## 2021-05-14 DIAGNOSIS — M5416 Radiculopathy, lumbar region: Secondary | ICD-10-CM

## 2021-05-15 ENCOUNTER — Ambulatory Visit (INDEPENDENT_AMBULATORY_CARE_PROVIDER_SITE_OTHER): Payer: Medicare Other

## 2021-05-15 DIAGNOSIS — Z780 Asymptomatic menopausal state: Secondary | ICD-10-CM

## 2021-05-15 DIAGNOSIS — Z0000 Encounter for general adult medical examination without abnormal findings: Secondary | ICD-10-CM | POA: Diagnosis not present

## 2021-05-15 NOTE — Patient Instructions (Signed)
Amber Baird , Thank you for taking time to come for your Medicare Wellness Visit. I appreciate your ongoing commitment to your health goals. Please review the following plan we discussed and let me know if I can assist you in the future.   Screening recommendations/referrals: Colonoscopy: no longer required  Mammogram: no longer required  Bone Density: ordered 05/15/2021 Recommended yearly ophthalmology/optometry visit for glaucoma screening and checkup Recommended yearly dental visit for hygiene and checkup  Vaccinations: Influenza vaccine: due in fall 2022  Pneumococcal vaccine: completed series  Tdap vaccine: 03/21/2017 Shingles vaccine: completed series     Advanced directives: will provide copies   Conditions/risks identified: none   Next appointment: CPE 06/16/2021  0930am  Dr. Martinique    Preventive Care 1 Years and Older, Female Preventive care refers to lifestyle choices and visits with your health care provider that can promote health and wellness. What does preventive care include? A yearly physical exam. This is also called an annual well check. Dental exams once or twice a year. Routine eye exams. Ask your health care provider how often you should have your eyes checked. Personal lifestyle choices, including: Daily care of your teeth and gums. Regular physical activity. Eating a healthy diet. Avoiding tobacco and drug use. Limiting alcohol use. Practicing safe sex. Taking low-dose aspirin every day. Taking vitamin and mineral supplements as recommended by your health care provider. What happens during an annual well check? The services and screenings done by your health care provider during your annual well check will depend on your age, overall health, lifestyle risk factors, and family history of disease. Counseling  Your health care provider may ask you questions about your: Alcohol use. Tobacco use. Drug use. Emotional well-being. Home and relationship  well-being. Sexual activity. Eating habits. History of falls. Memory and ability to understand (cognition). Work and work Statistician. Reproductive health. Screening  You may have the following tests or measurements: Height, weight, and BMI. Blood pressure. Lipid and cholesterol levels. These may be checked every 5 years, or more frequently if you are over 57 years old. Skin check. Lung cancer screening. You may have this screening every year starting at age 74 if you have a 30-pack-year history of smoking and currently smoke or have quit within the past 15 years. Fecal occult blood test (FOBT) of the stool. You may have this test every year starting at age 45. Flexible sigmoidoscopy or colonoscopy. You may have a sigmoidoscopy every 5 years or a colonoscopy every 10 years starting at age 52. Hepatitis C blood test. Hepatitis B blood test. Sexually transmitted disease (STD) testing. Diabetes screening. This is done by checking your blood sugar (glucose) after you have not eaten for a while (fasting). You may have this done every 1-3 years. Bone density scan. This is done to screen for osteoporosis. You may have this done starting at age 90. Mammogram. This may be done every 1-2 years. Talk to your health care provider about how often you should have regular mammograms. Talk with your health care provider about your test results, treatment options, and if necessary, the need for more tests. Vaccines  Your health care provider may recommend certain vaccines, such as: Influenza vaccine. This is recommended every year. Tetanus, diphtheria, and acellular pertussis (Tdap, Td) vaccine. You may need a Td booster every 10 years. Zoster vaccine. You may need this after age 27. Pneumococcal 13-valent conjugate (PCV13) vaccine. One dose is recommended after age 89. Pneumococcal polysaccharide (PPSV23) vaccine. One dose is recommended  after age 33. Talk to your health care provider about which  screenings and vaccines you need and how often you need them. This information is not intended to replace advice given to you by your health care provider. Make sure you discuss any questions you have with your health care provider. Document Released: 08/15/2015 Document Revised: 04/07/2016 Document Reviewed: 05/20/2015 Elsevier Interactive Patient Education  2017 [employer] Prevention in the Home Falls can cause injuries. They can happen to people of all ages. There are many things you can do to make your home safe and to help prevent falls. What can I do on the outside of my home? Regularly fix the edges of walkways and driveways and fix any cracks. Remove anything that might make you trip as you walk through a door, such as a raised step or threshold. Trim any bushes or trees on the path to your home. Use bright outdoor lighting. Clear any walking paths of anything that might make someone trip, such as rocks or tools. Regularly check to see if handrails are loose or broken. Make sure that both sides of any steps have handrails. Any raised decks and porches should have guardrails on the edges. Have any leaves, snow, or ice cleared regularly. Use sand or salt on walking paths during winter. Clean up any spills in your garage right away. This includes oil or grease spills. What can I do in the bathroom? Use night lights. Install grab bars by the toilet and in the tub and shower. Do not use towel bars as grab bars. Use non-skid mats or decals in the tub or shower. If you need to sit down in the shower, use a plastic, non-slip stool. Keep the floor dry. Clean up any water that spills on the floor as soon as it happens. Remove soap buildup in the tub or shower regularly. Attach bath mats securely with double-sided non-slip rug tape. Do not have throw rugs and other things on the floor that can make you trip. What can I do in the bedroom? Use night lights. Make sure that you have a  light by your bed that is easy to reach. Do not use any sheets or blankets that are too big for your bed. They should not hang down onto the floor. Have a firm chair that has side arms. You can use this for support while you get dressed. Do not have throw rugs and other things on the floor that can make you trip. What can I do in the kitchen? Clean up any spills right away. Avoid walking on wet floors. Keep items that you use a lot in easy-to-reach places. If you need to reach something above you, use a strong step stool that has a grab bar. Keep electrical cords out of the way. Do not use floor polish or wax that makes floors slippery. If you must use wax, use non-skid floor wax. Do not have throw rugs and other things on the floor that can make you trip. What can I do with my stairs? Do not leave any items on the stairs. Make sure that there are handrails on both sides of the stairs and use them. Fix handrails that are broken or loose. Make sure that handrails are as long as the stairways. Check any carpeting to make sure that it is firmly attached to the stairs. Fix any carpet that is loose or worn. Avoid having throw rugs at the top or bottom of the stairs. If you  do have throw rugs, attach them to the floor with carpet tape. Make sure that you have a light switch at the top of the stairs and the bottom of the stairs. If you do not have them, ask someone to add them for you. What else can I do to help prevent falls? Wear shoes that: Do not have high heels. Have rubber bottoms. Are comfortable and fit you well. Are closed at the toe. Do not wear sandals. If you use a stepladder: Make sure that it is fully opened. Do not climb a closed stepladder. Make sure that both sides of the stepladder are locked into place. Ask someone to hold it for you, if possible. Clearly mark and make sure that you can see: Any grab bars or handrails. First and last steps. Where the edge of each step  is. Use tools that help you move around (mobility aids) if they are needed. These include: Canes. Walkers. Scooters. Crutches. Turn on the lights when you go into a dark area. Replace any light bulbs as soon as they burn out. Set up your furniture so you have a clear path. Avoid moving your furniture around. If any of your floors are uneven, fix them. If there are any pets around you, be aware of where they are. Review your medicines with your doctor. Some medicines can make you feel dizzy. This can increase your chance of falling. Ask your doctor what other things that you can do to help prevent falls. This information is not intended to replace advice given to you by your health care provider. Make sure you discuss any questions you have with your health care provider. Document Released: 05/15/2009 Document Revised: 12/25/2015 Document Reviewed: 08/23/2014 Elsevier Interactive Patient Education  2017 [employer].

## 2021-05-15 NOTE — Progress Notes (Signed)
Subjective:   Amber Baird is a 86 y.o. female who presents for Medicare Annual (Subsequent) preventive examination.  I connected with Amber Baird today by telephone and verified that I am speaking with the correct person using two identifiers. Location patient: home Location provider: work Persons participating in the virtual visit: patient, provider.   I discussed the limitations, risks, security and privacy concerns of performing an evaluation and management service by telephone and the availability of in person appointments. I also discussed with the patient that there may be a patient responsible charge related to this service. The patient expressed understanding and verbally consented to this telephonic visit.    Interactive audio and video telecommunications were attempted between this provider and patient, however failed, due to patient having technical difficulties OR patient did not have access to video capability.  We continued and completed visit with audio only.     Review of Systems     Cardiac Risk Factors include: advanced age (>49men, >46 women);dyslipidemia;hypertension     Objective:    Today's Vitals   05/15/21 1046  PainSc: 4    There is no height or weight on file to calculate BMI.  Advanced Directives 05/15/2021 05/14/2020 01/27/2018 05/20/2016 05/20/2016 01/23/2016 01/13/2016  Does Patient Have a Medical Advance Directive? Yes Yes Yes Yes Yes Yes Yes  Type of Paramedic of [place];Living will [place]  Does patient want to make changes to medical advance directive? - No - Patient declined - - - - -  Copy of [place] in Chart? No - copy requested No - copy requested - No - copy requested No - copy requested - No - copy requested    Current Medications (verified) Outpatient Encounter Medications as of 05/15/2021  Medication Sig   atorvastatin  (LIPITOR) 40 MG tablet TAKE 1 TABLET DAILY   Biotin 5 MG CAPS Take 1 capsule by mouth daily.   gabapentin (NEURONTIN) 300 MG capsule TAKE 1 CAPSULE(300 MG) BY MOUTH EVERY EVENING   hydrochlorothiazide (HYDRODIURIL) 25 MG tablet TAKE 1 TABLET DAILY   lisinopril (ZESTRIL) 30 MG tablet TAKE 1 TABLET(30 MG) BY MOUTH DAILY   Magnesium 400 MG CAPS Take 400 mg by mouth daily.   Multiple Vitamin (MULTIVITAMIN) tablet Take 1 tablet by mouth daily.   omeprazole (PRILOSEC) 20 MG capsule TAKE 1 CAPSULE DAILY   spironolactone (ALDACTONE) 25 MG tablet TAKE 1 TABLET(25 MG) BY MOUTH DAILY   No facility-administered encounter medications on file as of 05/15/2021.    Allergies (verified) Carvedilol, Atenolol, Baclofen, Hydralazine hcl, Oxycodone, Sulfa antibiotics, Adhesive [tape], and Amlodipine   History: Past Medical History:  Diagnosis Date   Bullous pemphigoid    Hypertension    Past Surgical History:  Procedure Laterality Date   APPENDECTOMY  1980   BREAST BIOPSY  1980   SPINE SURGERY     VAGINAL HYSTERECTOMY  1980   Family History  Problem Relation Age of Onset   Arthritis Other    High Cholesterol Mother    Heart disease Mother    Hypertension Mother    Social History   Socioeconomic History   Marital status: Widowed    Spouse name: Not on file   Number of children: 1   Years of education: Not on file   Highest education level: Not on file  Occupational History   Not on file  Tobacco Use   Smoking status: Never  Smokeless tobacco: Never  Substance and Sexual Activity   Alcohol use: No    Alcohol/week: 0.0 standard drinks   Drug use: No   Sexual activity: Not on file  Other Topics Concern   Not on file  Social History Narrative   Not on file   Social Determinants of Health   Financial Resource Strain: Low Risk    Difficulty of Paying Living Expenses: Not hard at all  Food Insecurity: No Food Insecurity   Worried About Charity fundraiser in the Last Year: Never  true   [place] in the Last Year: Never true  Transportation Needs: No Transportation Needs   Lack of Transportation (Medical): No   Lack of Transportation (Non-Medical): No  Physical Activity: Inactive   Days of Exercise per Week: 0 days   Minutes of Exercise per Session: 0 min  Stress: No Stress Concern Present   Feeling of Stress : Not at all  Social Connections: Socially Isolated   Frequency of Communication with Friends and Family: Twice a week   Frequency of Social Gatherings with Friends and Family: Twice a week   Attends Religious Services: Never   Marine scientist or Organizations: No   Attends Archivist Meetings: Never   Marital Status: Widowed    Tobacco Counseling Counseling given: Not Answered   Clinical Intake:  Pre-visit preparation completed: Yes  Pain : 0-10 Pain Score: 4  Pain Type: Chronic pain Pain Location: Back Pain Orientation: Lower Pain Descriptors / Indicators: Constant Pain Onset: More than a month ago Pain Frequency: Constant     Nutritional Risks: None Diabetes: No  How often do you need to have someone help you when you read instructions, pamphlets, or other written materials from your doctor or pharmacy?: 1 - Never What is the last grade level you completed in school?: college  Diabetic?no  Interpreter Needed?: No  Information entered by :: L.Izaias Krupka,LPN   Activities of Daily Living In your present state of health, do you have any difficulty performing the following activities: 05/15/2021  Hearing? Y  Vision? N  Difficulty concentrating or making decisions? N  Walking or climbing stairs? N  Dressing or bathing? N  Doing errands, shopping? N  Preparing Food and eating ? N  Using the Toilet? N  In the past six months, have you accidently leaked urine? N  Do you have problems with loss of bowel control? N  Managing your Medications? N  Managing your Finances? N  Housekeeping or managing your  Housekeeping? N  Some recent data might be hidden    Patient Care Team: Martinique, Betty G, MD as PCP - General (Family Medicine)  Indicate any recent Medical Services you may have received from other than [facility] providers in the past year (date may be approximate).     Assessment:   This is a routine wellness examination for [place].  Hearing/Vision screen Vision Screening - Comments:: Annual eye exams   Dietary issues and exercise activities discussed: Current Exercise Habits: The patient does not participate in regular exercise at present, Exercise limited by: orthopedic condition(s);neurologic condition(s)   Goals Addressed             This Visit's Progress    Exercise 3x per week (30 min per time)   On track      Depression Screen PHQ 2/9 Scores 05/15/2021 05/15/2021 05/14/2020 03/12/2019 01/27/2018 08/31/2017 05/20/2016  PHQ - 2 Score 0 0 0 0 0 0 0  PHQ- 9 Score - - 0 - - - -    Fall Risk Fall Risk  05/15/2021 05/14/2020 03/12/2019 01/27/2018 12/12/2017  Falls in the past year? 0 0 0 Yes Yes  Number falls in past yr: 0 0 0 1 1  Injury with Fall? 0 0 0 - Yes  Risk for fall due to : - Orthopedic patient - - -  Follow up Falls evaluation completed Falls evaluation completed;Falls prevention discussed Education provided Education provided -    FALL RISK PREVENTION PERTAINING TO THE HOME:  Any stairs in or around the home? No  If so, are there any without handrails? No  Home free of loose throw rugs in walkways, pet beds, electrical cords, etc? Yes  Adequate lighting in your home to reduce risk of falls? Yes   ASSISTIVE DEVICES UTILIZED TO PREVENT FALLS:  Life alert? No  Use of a cane, walker or w/c? No  Grab bars in the bathroom? No  Shower chair or bench in shower? Yes  Elevated toilet seat or a handicapped toilet? Yes    Cognitive Function: Normal cognitive status assessed by direct observation by this Nurse Health Advisor. No abnormalities found.   MMSE - Mini  Mental State Exam 01/27/2018 05/20/2016  Not completed: (No Data) (No Data)     6CIT Screen 05/14/2020  What Year? 0 points  What month? 0 points  What time? 0 points  Count back from 20 0 points  Months in reverse 0 points  Repeat phrase 2 points  Total Score 2    Immunizations Immunization History  Administered Date(s) Administered   Fluad Quad(high Dose 65+) 04/21/2020   Influenza, High Dose Seasonal PF 05/20/2016, 05/31/2018   Influenza-Unspecified 05/20/2016, 05/26/2017   PFIZER(Purple Top)SARS-COV-2 Vaccination 08/25/2019, 09/15/2019, 04/28/2020, 04/28/2020   Pneumococcal Conjugate-13 08/02/2013   Pneumococcal Polysaccharide-23 08/02/2008, 07/20/2013   Tdap 10/07/2016   Zoster Recombinat (Shingrix) 12/09/2016, 03/21/2017   Zoster, Live 08/03/2003, 12/09/2016    TDAP status: Up to date  Flu Vaccine status: Up to date  Pneumococcal vaccine status: Up to date  Covid-19 vaccine status: Completed vaccines  Qualifies for Shingles Vaccine? Yes   Zostavax completed No   Shingrix Completed?: Yes  Screening Tests Health Maintenance  Topic Date Due   DEXA SCAN  Never done   INFLUENZA VACCINE  03/02/2021   TETANUS/TDAP  10/08/2026   COVID-19 Vaccine  Completed   Zoster Vaccines- Shingrix  Completed   HPV VACCINES  Aged Out    Health Maintenance  Health Maintenance Due  Topic Date Due   DEXA SCAN  Never done   INFLUENZA VACCINE  03/02/2021    Colorectal cancer screening: No longer required.   Mammogram status: No longer required due to age.  Bone Density status: Ordered 05/15/2021. Pt provided with contact info and advised to call to schedule appt.  Lung Cancer Screening: (Low Dose CT Chest recommended if Age 26-80 years, 30 pack-year currently smoking OR have quit w/in 15years.) does not qualify.   Lung Cancer Screening Referral: n/a  Additional Screening:  Hepatitis C Screening: does not qualify;   Vision Screening: Recommended annual ophthalmology  exams for early detection of glaucoma and other disorders of the eye. Is the patient up to date with their annual eye exam?  Yes  Who is the provider or what is the name of the office in which the patient attends annual eye exams? Dr.Hecker  If pt is not established with a provider, would they like to be referred to  a provider to establish care? No .   Dental Screening: Recommended annual dental exams for proper oral hygiene  Community Resource Referral / Chronic Care Management: CRR required this visit?  No   CCM required this visit?  No      Plan:     I have personally reviewed and noted the following in the patient's chart:   Medical and social history Use of alcohol, tobacco or illicit drugs  Current medications and supplements including opioid prescriptions.  Functional ability and status Nutritional status Physical activity Advanced directives List of other physicians Hospitalizations, surgeries, and ER visits in previous 12 months Vitals Screenings to include cognitive, depression, and falls Referrals and appointments  In addition, I have reviewed and discussed with patient certain preventive protocols, quality metrics, and best practice recommendations. A written personalized care plan for preventive services as well as general preventive health recommendations were provided to patient.     Randel Pigg, LPN   [id number]   Nurse Notes: none

## 2021-05-21 ENCOUNTER — Other Ambulatory Visit: Payer: Self-pay | Admitting: Family Medicine

## 2021-05-21 DIAGNOSIS — G4700 Insomnia, unspecified: Secondary | ICD-10-CM

## 2021-05-21 DIAGNOSIS — I10 Essential (primary) hypertension: Secondary | ICD-10-CM

## 2021-06-11 DIAGNOSIS — H2513 Age-related nuclear cataract, bilateral: Secondary | ICD-10-CM | POA: Diagnosis not present

## 2021-06-11 DIAGNOSIS — H35372 Puckering of macula, left eye: Secondary | ICD-10-CM | POA: Diagnosis not present

## 2021-06-11 DIAGNOSIS — L121 Cicatricial pemphigoid: Secondary | ICD-10-CM | POA: Diagnosis not present

## 2021-06-11 DIAGNOSIS — H35033 Hypertensive retinopathy, bilateral: Secondary | ICD-10-CM | POA: Diagnosis not present

## 2021-06-15 NOTE — Progress Notes (Signed)
[name] is a 86 y.o.female, who is here today to follow on HTN.  Last follow up visit: 11/18/20 for submental mass, this has resolved.  CKD II: Since her last visit she has seen nephrologist and recommended continuing with annual follow-ups No medication changes. Negative for gross hematuria,foam in urine,or decreased urine output.  Chronic lower back pain, she is following with neurosurgeon q 6 weeks.  She has seen her ophthalmologist and planning on cataract surgery maybe in a year. Hearing was checked recently, according to pt it was slight worse, she is wearing hearing aids.  Her sister Dx'ed with "terminal" renal cancer. She is planning [place].  Hypertension:  Medications:Lisinopril 30 mg daily, HCTZ 25 mg daily, Spironolactone 25 mg daily. BP readings at home:Not checking.BP is checked in the neurosurgeon's office is usually at goal. Side effects:None Negative for unusual or severe headache, visual changes, exertional chest pain, palpitations,dyspnea,  focal weakness, or edema.  Lab Results  Component Value Date   CREATININE 1.21 (H) 11/18/2020   BUN 32 (H) 11/18/2020   NA 136 11/18/2020   K 4.4 11/18/2020   CL 100 11/18/2020   CO2 25 11/18/2020   Insomnia: Gabapentin 300 mg at bedtime is still helping. Sleeping 6-7 hours.  Review of Systems  Constitutional:  Negative for activity change, appetite change, chills and fever.  Respiratory:  Negative for cough and wheezing.   Gastrointestinal:  Negative for abdominal pain, nausea and vomiting.  Musculoskeletal:  Positive for back pain.  Neurological:  Negative for syncope and facial asymmetry.  Rest see pertinent positives and negatives per HPI.  Current Outpatient Medications on File Prior to Visit  Medication Sig Dispense Refill   atorvastatin (LIPITOR) 40 MG tablet TAKE 1 TABLET DAILY 90 tablet 3   Biotin 5 MG CAPS Take 1 capsule by mouth daily.      gabapentin (NEURONTIN) 300 MG capsule TAKE 1 CAPSULE(300 MG) BY MOUTH EVERY EVENING 30 capsule 3   hydrochlorothiazide (HYDRODIURIL) 25 MG tablet TAKE 1 TABLET DAILY 90 tablet 3   lisinopril (ZESTRIL) 30 MG tablet TAKE 1 TABLET(30 MG) BY MOUTH DAILY 90 tablet 2   Magnesium 400 MG CAPS Take 400 mg by mouth daily.     Multiple Vitamin (MULTIVITAMIN) tablet Take 1 tablet by mouth daily.     omeprazole (PRILOSEC) 20 MG capsule TAKE 1 CAPSULE DAILY 90 capsule 3   spironolactone (ALDACTONE) 25 MG tablet TAKE 1 TABLET(25 MG) BY MOUTH DAILY 90 tablet 1   No current facility-administered medications on file prior to visit.   Past Medical History:  Diagnosis Date   Bullous pemphigoid    Hypertension    Allergies  Allergen Reactions   Carvedilol Diarrhea   Atenolol Other (See Comments)    Drops heart rate   Baclofen Other (See Comments)    Stroke like symptoms    Hydralazine Hcl Swelling and Other (See Comments)    Swelling on face, arms red from elbow to fingers.   Oxycodone Other (See Comments)    Confusion    Sulfa Antibiotics Other (See Comments)    Childhood allergy    Adhesive [Tape] Rash   Amlodipine Rash and Other (See Comments)    flushing    Social History   Socioeconomic History   Marital status: Widowed    Spouse name: Not on file   Number of children: 1   Years of education: Not on file   Highest  education level: Not on file  Occupational History   Not on file  Tobacco Use   Smoking status: Never   Smokeless tobacco: Never  Substance and Sexual Activity   Alcohol use: No    Alcohol/week: 0.0 standard drinks   Drug use: No   Sexual activity: Not on file  Other Topics Concern   Not on file  Social History Narrative   Not on file   Social Determinants of Health   Financial Resource Strain: Low Risk    Difficulty of Paying Living Expenses: Not hard at all  Food Insecurity: No Food Insecurity   Worried About Charity fundraiser in the Last Year: Never true    [place] in the Last Year: Never true  Transportation Needs: No Transportation Needs   Lack of Transportation (Medical): No   Lack of Transportation (Non-Medical): No  Physical Activity: Inactive   Days of Exercise per Week: 0 days   Minutes of Exercise per Session: 0 min  Stress: No Stress Concern Present   Feeling of Stress : Not at all  Social Connections: Socially Isolated   Frequency of Communication with Friends and Family: Twice a week   Frequency of Social Gatherings with Friends and Family: Twice a week   Attends Religious Services: Never   Marine scientist or Organizations: No   Attends Archivist Meetings: Never   Marital Status: Widowed   Vitals:   06/16/21 0935  BP: 122/78  Pulse: 68  Resp: 16  Temp: 97.7 F (36.5 C)  SpO2: 98%   Body mass index is 24.9 kg/m.  Physical Exam Vitals and nursing note reviewed.  Constitutional:      General: She is not in acute distress.    Appearance: She is well-developed.  HENT:     Head: Normocephalic and atraumatic.     Mouth/Throat:     Mouth: Mucous membranes are moist.     Pharynx: Oropharynx is clear.  Eyes:     Conjunctiva/sclera: Conjunctivae normal.  Cardiovascular:     Rate and Rhythm: Normal rate and regular rhythm.     Pulses:          Dorsalis pedis pulses are 2+ on the right side and 2+ on the left side.     Heart sounds: Murmur (SEM I/VI RUSB) heard.  Pulmonary:     Effort: Pulmonary effort is normal. No respiratory distress.     Breath sounds: Normal breath sounds.  Abdominal:     Palpations: Abdomen is soft. There is no hepatomegaly or mass.     Tenderness: There is no abdominal tenderness.  Lymphadenopathy:     Cervical: No cervical adenopathy.  Skin:    General: Skin is warm.     Findings: No erythema or rash.  Neurological:     General: No focal deficit present.     Mental Status: She is alert and oriented to person, place, and time.     Cranial Nerves: No cranial  nerve deficit.     Comments: Stable gait, not assisted.  Psychiatric:     Comments: Well groomed, good eye contact.   ASSESSMENT AND PLAN:   [name] was seen today for follow-up.  Diagnoses and all orders for this visit: Orders Placed This Encounter  Procedures   Basic metabolic panel   Lab Results  Component Value Date   CREATININE 1.25 (H) 06/16/2021   BUN 26 (H) 06/16/2021   NA 134 (L) 06/16/2021   K  4.2 06/16/2021   CL 101 06/16/2021   CO2 22 06/16/2021   Essential hypertension BP adequately controlled. Continue current management: Lisinopril 30 mg daily, HCTZ 25 mg daily, Spironolactone 25 mg daily. DASH/low salt diet to continue. Eye exam is current.  Insomnia Problem is well controlled with Gabapentin, so no changes in current dose. Good sleep hygiene to continue.  CKD (chronic kidney disease), stage III Problem has been stable. Cr 1.2-1.4 and e GFR in the 40's. Continue lisinopril, adequate hydration, low salt diet, avoidance of NSAID's,and adequate BP controlled. Following with nephrologist annually.  Return in about 6 months (around 12/14/2021).  Jonavin Seder G. [place], MD  [facility]. [place] office.

## 2021-06-16 ENCOUNTER — Encounter: Payer: Self-pay | Admitting: Family Medicine

## 2021-06-16 ENCOUNTER — Ambulatory Visit (INDEPENDENT_AMBULATORY_CARE_PROVIDER_SITE_OTHER): Payer: Medicare Other | Admitting: Family Medicine

## 2021-06-16 VITALS — BP 122/78 | HR 68 | Temp 97.7°F | Resp 16 | Ht 61.0 in | Wt 131.8 lb

## 2021-06-16 DIAGNOSIS — I10 Essential (primary) hypertension: Secondary | ICD-10-CM

## 2021-06-16 DIAGNOSIS — E7800 Pure hypercholesterolemia, unspecified: Secondary | ICD-10-CM

## 2021-06-16 DIAGNOSIS — G4700 Insomnia, unspecified: Secondary | ICD-10-CM | POA: Diagnosis not present

## 2021-06-16 DIAGNOSIS — N1832 Chronic kidney disease, stage 3b: Secondary | ICD-10-CM

## 2021-06-16 LAB — BASIC METABOLIC PANEL
BUN: 26 mg/dL — ABNORMAL HIGH (ref 6–23)
CO2: 22 mEq/L (ref 19–32)
Calcium: 9.6 mg/dL (ref 8.4–10.5)
Chloride: 101 mEq/L (ref 96–112)
Creatinine, Ser: 1.25 mg/dL — ABNORMAL HIGH (ref 0.40–1.20)
GFR: 39.51 mL/min — ABNORMAL LOW (ref >60.00)
Glucose, Bld: 85 mg/dL (ref 70–99)
Potassium: 4.2 mEq/L (ref 3.5–5.1)
Sodium: 134 mEq/L — ABNORMAL LOW (ref 135–145)

## 2021-06-16 NOTE — Assessment & Plan Note (Addendum)
Problem has been stable. Cr 1.2-1.4 and e GFR in the 40's. Continue lisinopril, adequate hydration, low salt diet, avoidance of NSAID's,and adequate BP controlled. Following with nephrologist annually.

## 2021-06-16 NOTE — Patient Instructions (Signed)
A few things to remember from today's visit:  Essential hypertension - Plan: Basic metabolic panel  Stage 3b chronic kidney disease ([place])  Insomnia, unspecified type  If you need refills please call your pharmacy. Do not use My Chart to request refills or for acute issues that need immediate attention.   No changes today.  Please be sure medication list is accurate. If a new problem present, please set up appointment sooner than planned today.

## 2021-06-16 NOTE — Assessment & Plan Note (Signed)
BP adequately controlled. Continue current management: Lisinopril 30 mg daily, HCTZ 25 mg daily, Spironolactone 25 mg daily. DASH/low salt diet to continue. Eye exam is current.

## 2021-06-16 NOTE — Assessment & Plan Note (Signed)
Problem is well controlled with Gabapentin, so no changes in current dose. Good sleep hygiene to continue.

## 2021-06-22 ENCOUNTER — Other Ambulatory Visit: Payer: Self-pay | Admitting: Family Medicine

## 2021-06-22 DIAGNOSIS — E7800 Pure hypercholesterolemia, unspecified: Secondary | ICD-10-CM

## 2021-07-01 ENCOUNTER — Other Ambulatory Visit: Payer: Self-pay | Admitting: Family Medicine

## 2021-07-01 DIAGNOSIS — E2839 Other primary ovarian failure: Secondary | ICD-10-CM

## 2021-07-03 ENCOUNTER — Ambulatory Visit
Admission: RE | Admit: 2021-07-03 | Discharge: 2021-07-03 | Disposition: A | Discharge status: Home / Self Care | Payer: Medicare Other | Source: Ambulatory Visit | Attending: Family Medicine | Admitting: Family Medicine

## 2021-07-03 DIAGNOSIS — E2839 Other primary ovarian failure: Secondary | ICD-10-CM

## 2021-07-03 DIAGNOSIS — Z780 Asymptomatic menopausal state: Secondary | ICD-10-CM | POA: Diagnosis not present

## 2021-07-03 DIAGNOSIS — M8589 Other specified disorders of bone density and structure, multiple sites: Secondary | ICD-10-CM | POA: Diagnosis not present

## 2021-07-19 ENCOUNTER — Encounter: Payer: Self-pay | Admitting: Family Medicine

## 2021-07-19 DIAGNOSIS — M8580 Other specified disorders of bone density and structure, unspecified site: Secondary | ICD-10-CM | POA: Insufficient documentation

## 2021-07-20 ENCOUNTER — Other Ambulatory Visit: Payer: Self-pay

## 2021-07-20 MED ORDER — ALENDRONATE SODIUM 70 MG PO TABS: 70.0000 mg | ORAL_TABLET | ORAL | 11 refills | Status: DC

## 2021-07-22 ENCOUNTER — Other Ambulatory Visit: Payer: Self-pay | Admitting: Family Medicine

## 2021-07-22 DIAGNOSIS — I10 Essential (primary) hypertension: Secondary | ICD-10-CM

## 2021-08-19 ENCOUNTER — Other Ambulatory Visit: Payer: Self-pay | Admitting: Family Medicine

## 2021-09-07 ENCOUNTER — Encounter: Payer: Self-pay | Admitting: Family Medicine

## 2021-09-07 DIAGNOSIS — Z85828 Personal history of other malignant neoplasm of skin: Secondary | ICD-10-CM | POA: Diagnosis not present

## 2021-09-07 DIAGNOSIS — D2239 Melanocytic nevi of other parts of face: Secondary | ICD-10-CM | POA: Diagnosis not present

## 2021-09-07 DIAGNOSIS — L720 Epidermal cyst: Secondary | ICD-10-CM | POA: Diagnosis not present

## 2021-09-07 DIAGNOSIS — D485 Neoplasm of uncertain behavior of skin: Secondary | ICD-10-CM | POA: Diagnosis not present

## 2021-09-14 NOTE — Progress Notes (Addendum)
ACUTE VISIT Chief Complaint  Patient presents with   Diarrhea    X 2-3 years; possible medication reaction?   HPI: Amber Baird is a 87 y.o. female, who is here today complaining of intermittent episodes of diarrhea as described above.  Diarrhea  This is a chronic problem. The current episode started more than 1 year ago. The problem occurs 5 to 10 times per day. The problem has been unchanged. The stool consistency is described as Watery. The patient states that diarrhea does not awaken her from sleep. Pertinent negatives include no abdominal pain, arthralgias, bloating, chills, coughing, fever, headaches, increased  flatus, myalgias, sweats, URI, vomiting or weight loss. Nothing aggravates the symptoms.  She thinks it is caused by spironolactone. She tried to stop med and BP went up.  Diarrhea happens once per week but it is severe with episodes of incontinence, 5-6 in a day. No mucus or blood in stool. She has not identified exacerbating or alleviating factors. Around times problem started she did not travel or no sick contact. Imodium helps sometimes. Lab Results  Component Value Date   TSH 1.95 05/19/2020   HTN on Spironolactone 25 mg daily,Lisinopril 30 mg daily,and HCTZ 25 mg daily. She has not tolerated well carvedilol,atenolol,hydralazine,and Amlodipine. Negative for severe/frequent headache, visual changes, chest pain, dyspnea, palpitation, focal weakness, or edema. CKD III: She has not noted gross hematuria,decreased urina output,or foam in urine. Hx of hypoNa++  Lab Results  Component Value Date   CREATININE 1.25 (H) 06/16/2021   BUN 26 (H) 06/16/2021   NA 134 (L) 06/16/2021   K 4.2 06/16/2021   CL 101 06/16/2021   CO2 22 06/16/2021   Lab Results  Component Value Date   WBC 7.8 11/18/2020   HGB 13.5 11/18/2020   HCT 40.1 11/18/2020   MCV 94.1 11/18/2020   PLT 267.0 11/18/2020  . Review of Systems  Constitutional:  Negative for chills, fatigue,  fever and weight loss.  HENT:  Negative for mouth sores and sore throat.   Respiratory:  Negative for cough and wheezing.   Cardiovascular:  Negative for chest pain.  Gastrointestinal:  Positive for diarrhea. Negative for abdominal pain, bloating, flatus and vomiting.  Endocrine: Negative for cold intolerance and heat intolerance.  Musculoskeletal:  Negative for arthralgias and myalgias.  Neurological:  Negative for syncope, facial asymmetry, weakness and headaches.  Rest see pertinent positives and negatives per HPI.  Current Outpatient Medications on File Prior to Visit  Medication Sig Dispense Refill   alendronate (FOSAMAX) 70 MG tablet Take 1 tablet (70 mg total) by mouth every 7 (seven) days. Take with a full glass of water on an empty stomach. 4 tablet 11   atorvastatin (LIPITOR) 40 MG tablet TAKE 1 TABLET DAILY 90 tablet 3   Biotin 5 MG CAPS Take 1 capsule by mouth daily.     gabapentin (NEURONTIN) 300 MG capsule TAKE 1 CAPSULE(300 MG) BY MOUTH EVERY EVENING 30 capsule 3   hydrochlorothiazide (HYDRODIURIL) 25 MG tablet TAKE 1 TABLET DAILY 90 tablet 3   lisinopril (ZESTRIL) 30 MG tablet TAKE 1 TABLET(30 MG) BY MOUTH DAILY 90 tablet 2   Magnesium 400 MG CAPS Take 400 mg by mouth daily.     Multiple Vitamin (MULTIVITAMIN) tablet Take 1 tablet by mouth daily.     omeprazole (PRILOSEC) 20 MG capsule TAKE 1 CAPSULE DAILY 90 capsule 3   No current facility-administered medications on file prior to visit.    Past Medical History:  Diagnosis  Date   Bullous pemphigoid    Hypertension    Allergies  Allergen Reactions   Carvedilol Diarrhea   Atenolol Other (See Comments)    Drops heart rate   Baclofen Other (See Comments)    Stroke like symptoms    Hydralazine Hcl Swelling and Other (See Comments)    Swelling on face, arms red from elbow to fingers.   Oxycodone Other (See Comments)    Confusion    Sulfa Antibiotics Other (See Comments)    Childhood allergy    Adhesive [Tape]  Rash   Amlodipine Rash and Other (See Comments)    flushing    Social History   Socioeconomic History   Marital status: Widowed    Spouse name: Not on file   Number of children: 1   Years of education: Not on file   Highest education level: Not on file  Occupational History   Not on file  Tobacco Use   Smoking status: Never   Smokeless tobacco: Never  Substance and Sexual Activity   Alcohol use: No    Alcohol/week: 0.0 standard drinks   Drug use: No   Sexual activity: Not on file  Other Topics Concern   Not on file  Social History Narrative   Not on file   Social Determinants of Health   Financial Resource Strain: Low Risk    Difficulty of Paying Living Expenses: Not hard at all  Food Insecurity: No Food Insecurity   Worried About Charity fundraiser in the Last Year: Never true   [place] in the Last Year: Never true  Transportation Needs: No Transportation Needs   Lack of Transportation (Medical): No   Lack of Transportation (Non-Medical): No  Physical Activity: Inactive   Days of Exercise per Week: 0 days   Minutes of Exercise per Session: 0 min  Stress: No Stress Concern Present   Feeling of Stress : Not at all  Social Connections: Socially Isolated   Frequency of Communication with Friends and Family: Twice a week   Frequency of Social Gatherings with Friends and Family: Twice a week   Attends Religious Services: Never   Marine scientist or Organizations: No   Attends Archivist Meetings: Never   Marital Status: Widowed    Vitals:   09/15/21 1347  BP: 118/70  Pulse: 90  Resp: 16  Temp: 98 F (36.7 C)  SpO2: 98%   Body mass index is 24.96 kg/m.  Physical Exam Vitals and nursing note reviewed.  Constitutional:      General: She is not in acute distress.    Appearance: She is well-developed.  HENT:     Head: Normocephalic and atraumatic.     Mouth/Throat:     Mouth: Mucous membranes are moist.     Pharynx: Oropharynx is  clear.  Eyes:     Conjunctiva/sclera: Conjunctivae normal.  Cardiovascular:     Rate and Rhythm: Normal rate and regular rhythm.     Heart sounds: Murmur (SEM I/VI RUSB.) heard.  Pulmonary:     Effort: Pulmonary effort is normal. No respiratory distress.     Breath sounds: Normal breath sounds.  Abdominal:     Palpations: Abdomen is soft. There is no hepatomegaly or mass.     Tenderness: There is no abdominal tenderness.  Lymphadenopathy:     Cervical: No cervical adenopathy.  Skin:    General: Skin is warm.     Findings: No erythema or rash.  Neurological:     General: No focal deficit present.     Mental Status: She is alert and oriented to person, place, and time.     Cranial Nerves: No cranial nerve deficit.     Gait: Gait normal.     Comments: Antalgic gait  Psychiatric:     Comments: Well groomed, good eye contact.   ASSESSMENT AND PLAN:  Amber Baird was seen today for diarrhea.  Diagnoses and all orders for this visit:  Essential hypertension BP today on lower normal range. She has not tolerated other antihypertensive meds, no clear allergic reaction, mostly side effects (beta blockers,hydralazine,amlodipine). She thinks spironolactone is causing diarrhea ,so discontinued. Instructed to monitor bp for a few days if if > 140/90, she can try Diltiazem 30 mg tid. We discussed side effects, I am hoping she tolerates this one better.   CKD (chronic kidney disease), stage III Problem has been stable. Continue Lisinopril 30 mg. Diltiazem added today. Adequate BP control, low salt diet, adequate hydration,and continue avoiding NDAID;s. F/U with nephrologist due in 12/2021.  Diarrhea Chronic 2-3 years. We discussed possible etiologies. Spironolactone can certainly be causing problem. Magnesium could also be a contributing factor. We are holding on blood work and stool examination for now. If not resolved after discontinuation of Spironolactone we can start evaluating for  other  possible causes and GI evaluation may be necessary.  Return in about 6 weeks (around 10/27/2021).  Amber Romanello G. [place], MD  [facility]. [place] office.

## 2021-09-15 ENCOUNTER — Ambulatory Visit (INDEPENDENT_AMBULATORY_CARE_PROVIDER_SITE_OTHER): Payer: Medicare Other | Admitting: Family Medicine

## 2021-09-15 ENCOUNTER — Other Ambulatory Visit: Payer: Self-pay | Admitting: Family Medicine

## 2021-09-15 ENCOUNTER — Encounter: Payer: Self-pay | Admitting: Family Medicine

## 2021-09-15 VITALS — BP 118/70 | HR 90 | Temp 98.0°F | Resp 16 | Ht 61.0 in | Wt 132.1 lb

## 2021-09-15 DIAGNOSIS — I10 Essential (primary) hypertension: Secondary | ICD-10-CM

## 2021-09-15 DIAGNOSIS — R197 Diarrhea, unspecified: Secondary | ICD-10-CM | POA: Diagnosis not present

## 2021-09-15 DIAGNOSIS — K529 Noninfective gastroenteritis and colitis, unspecified: Secondary | ICD-10-CM | POA: Insufficient documentation

## 2021-09-15 DIAGNOSIS — N1832 Chronic kidney disease, stage 3b: Secondary | ICD-10-CM

## 2021-09-15 MED ORDER — DILTIAZEM HCL 30 MG PO TABS: 30.0000 mg | ORAL_TABLET | Freq: Three times a day (TID) | ORAL | 1 refills | Status: DC

## 2021-09-15 NOTE — Assessment & Plan Note (Signed)
BP today on lower normal range. She has not tolerated other antihypertensive meds, no clear allergic reaction, mostly side effects (beta blockers,hydralazine,amlodipine). She thinks spironolactone is causing diarrhea ,so discontinued. Instructed to monitor bp for a few days if if > 140/90, she can try Diltiazem 30 mg tid. We discussed side effects, I am hoping she tolerates this one better.

## 2021-09-15 NOTE — Assessment & Plan Note (Addendum)
Chronic 2-3 years. We discussed possible etiologies. Spironolactone can certainly be causing problem. Magnesium could also be a contributing factor. We are holding on blood work and stool examination for now. If not resolved after discontinuation of Spironolactone we can start evaluating for other  possible causes and GI evaluation may be necessary.

## 2021-09-15 NOTE — Assessment & Plan Note (Signed)
Problem has been stable. Continue Lisinopril 30 mg. Diltiazem added today. Adequate BP control, low salt diet, adequate hydration,and continue avoiding NDAID;s. F/U with nephrologist due in 12/2021.

## 2021-09-15 NOTE — Patient Instructions (Addendum)
A few things to remember from today's visit:  Stage 3b chronic kidney disease ([place])  Essential hypertension - Plan: diltiazem (CARDIZEM) 30 MG tablet  Diarrhea, unspecified type  If you need refills please call your pharmacy. Do not use My Chart to request refills or for acute issues that need immediate attention.   Stop Spironolactone. You could monitor blood pressure for a few days before starting new medication. If elevated, we can try Diltiazem 30 mg 3 times per day.This is a different type of calcium channel blocker, similar but no the same that amlodipine. Let me know if ypu have any problem.  No changes in Lisinopril or HCTZ. Continue monitoring blood pressure regularly.  Please be sure medication list is accurate. If a new problem present, please set up appointment sooner than planned today.

## 2021-11-02 NOTE — Progress Notes (Signed)
? ?HPI: ?[name] is a 87 y.o. female, who is here today for follow up. ?She was seen on 09/15/21, when she was c/o diarrhea, attributed to spironolactone. ?Intermittent, about once per week and about 5-6 stools per day. ?Spironolactone was stopped and Diltiazem 30 mg tid was started. ?Diarrhea has resolved. ?She is also on HCTZ 25 mg daily and Lisinopril 30 mg daily. ?BP readings at home: Most in the 120-130's/60-70's, a few SBP's 110's, seldom < 110. ?She has tolerated medication well. ?HR 70-80's/min, occasionally in the mid or high 50's. ? ?Negative for headache, visual changes, chest pain, dyspnea, palpitation,focal weakness, or edema. ? ?Lab Results  ?Component Value Date  ? CREATININE 1.25 (H) 06/16/2021  ? BUN 26 (H) 06/16/2021  ? NA 134 (L) 06/16/2021  ? K 4.2 06/16/2021  ? CL 101 06/16/2021  ? CO2 22 06/16/2021  ?CKD III: She has not noted foam in urine,gross hematuria,or decreased urine output. ? ?Next appt with nephrologist in 12/2021. ? ?Aortic atherosclerosis seen on abdominal CT in 01/2016. ?She is on Atorvastatin 40 mg daily. ?Lab Results  ?Component Value Date  ? CHOL 164 11/18/2020  ? HDL 54.70 11/18/2020  ? [place] 81 11/18/2020  ? TRIG 139.0 11/18/2020  ? CHOLHDL 3 11/18/2020  ? ? ?Review of Systems  ?Constitutional:  Negative for activity change, appetite change, fatigue and fever.  ?Respiratory:  Negative for cough and wheezing.   ?Gastrointestinal:  Negative for abdominal pain, nausea and vomiting.  ?Neurological:  Negative for syncope and facial asymmetry.  ?Rest see pertinent positives and negatives per HPI. ? ?Current Outpatient Medications on File Prior to Visit  ?Medication Sig Dispense Refill  ? alendronate (FOSAMAX) 70 MG tablet Take 1 tablet (70 mg total) by mouth every 7 (seven) days. Take with a full glass of water on an empty stomach. 4 tablet 11  ? atorvastatin (LIPITOR) 40 MG tablet TAKE 1 TABLET DAILY 90 tablet 3  ? Biotin 5 MG CAPS Take 1 capsule by mouth daily.    ?  diltiazem (CARDIZEM) 30 MG tablet TAKE 1 TABLET(30 MG) BY MOUTH THREE TIMES DAILY 270 tablet 0  ? gabapentin (NEURONTIN) 300 MG capsule TAKE 1 CAPSULE(300 MG) BY MOUTH EVERY EVENING 30 capsule 3  ? hydrochlorothiazide (HYDRODIURIL) 25 MG tablet TAKE 1 TABLET DAILY 90 tablet 3  ? lisinopril (ZESTRIL) 30 MG tablet TAKE 1 TABLET(30 MG) BY MOUTH DAILY 90 tablet 2  ? Magnesium 400 MG CAPS Take 400 mg by mouth daily.    ? Multiple Vitamin (MULTIVITAMIN) tablet Take 1 tablet by mouth daily.    ? omeprazole (PRILOSEC) 20 MG capsule TAKE 1 CAPSULE DAILY 90 capsule 3  ? ?No current facility-administered medications on file prior to visit.  ? ?Past Medical History:  ?Diagnosis Date  ? Bullous pemphigoid   ? Hypertension   ? ?Allergies  ?Allergen Reactions  ? Carvedilol Diarrhea  ? Atenolol Other (See Comments)  ?  Drops heart rate  ? Baclofen Other (See Comments)  ?  Stroke like symptoms   ? Hydralazine Hcl Swelling and Other (See Comments)  ?  Swelling on face, arms red from elbow to fingers.  ? Oxycodone Other (See Comments)  ?  Confusion   ? Sulfa Antibiotics Other (See Comments)  ?  Childhood allergy   ? Adhesive [Tape] Rash  ? Amlodipine Rash and Other (See Comments)  ?  flushing  ? ? ?Social History  ? ?Socioeconomic History  ? Marital status: Widowed  ?  Spouse name: Not on file  ? Number of children: 1  ? Years of education: Not on file  ? Highest education level: Not on file  ?Occupational History  ? Not on file  ?Tobacco Use  ? Smoking status: Never  ? Smokeless tobacco: Never  ?Substance and Sexual Activity  ? Alcohol use: No  ?  Alcohol/week: 0.0 standard drinks  ? Drug use: No  ? Sexual activity: Not on file  ?Other Topics Concern  ? Not on file  ?Social History Narrative  ? Not on file  ? ?Social Determinants of Health  ? ?Financial Resource Strain: Low Risk   ? Difficulty of Paying Living Expenses: Not hard at all  ?Food Insecurity: No Food Insecurity  ? Worried About Charity fundraiser in the Last Year: Never  true  ? Ran Out of Food in the Last Year: Never true  ?Transportation Needs: No Transportation Needs  ? Lack of Transportation (Medical): No  ? Lack of Transportation (Non-Medical): No  ?Physical Activity: Inactive  ? Days of Exercise per Week: 0 days  ? Minutes of Exercise per Session: 0 min  ?Stress: No Stress Concern Present  ? Feeling of Stress : Not at all  ?Social Connections: Socially Isolated  ? Frequency of Communication with Friends and Family: Twice a week  ? Frequency of Social Gatherings with Friends and Family: Twice a week  ? Attends Religious Services: Never  ? Active Member of Clubs or Organizations: No  ? Attends Archivist Meetings: Never  ? Marital Status: Widowed  ? ? ?Vitals:  ? 11/03/21 1451  ?BP: 120/64  ?Pulse: 97  ?Resp: 16  ?SpO2: 99%  ? ?Body mass index is 25.34 kg/m?. ? ?Physical Exam ?Vitals and nursing note reviewed.  ?Constitutional:   ?   General: She is not in acute distress. ?   Appearance: She is well-developed.  ?HENT:  ?   Head: Normocephalic and atraumatic.  ?   Mouth/Throat:  ?   Mouth: Mucous membranes are moist.  ?   Pharynx: Oropharynx is clear.  ?Eyes:  ?   Conjunctiva/sclera: Conjunctivae normal.  ?Cardiovascular:  ?   Rate and Rhythm: Normal rate and regular rhythm.  ?   Heart sounds: Murmur (SEM I/VI RUSB) heard.  ?Pulmonary:  ?   Effort: Pulmonary effort is normal. No respiratory distress.  ?   Breath sounds: Normal breath sounds.  ?Abdominal:  ?   Palpations: Abdomen is soft. There is no hepatomegaly or mass.  ?   Tenderness: There is no abdominal tenderness.  ?Lymphadenopathy:  ?   Cervical: No cervical adenopathy.  ?Skin: ?   General: Skin is warm.  ?   Findings: No erythema or rash.  ?Neurological:  ?   General: No focal deficit present.  ?   Mental Status: She is alert and oriented to person, place, and time.  ?Psychiatric:  ?   Comments: Well groomed, good eye contact.  ? ? ?ASSESSMENT AND PLAN: ? ?[name] was seen today for follow-up. ? ?Diagnoses and  all orders for this visit: ?Orders Placed This Encounter  ?Procedures  ? Basic metabolic panel  ? ?Lab Results  ?Component Value Date  ? CREATININE 1.28 (H) 11/03/2021  ? BUN 26 (H) 11/03/2021  ? NA 135 11/03/2021  ? K 3.5 11/03/2021  ? CL 99 11/03/2021  ? CO2 28 11/03/2021  ? ?Essential hypertension ?BP adequately controlled. ?Continue Lisinopril,Diltiazem,and HCTZ same dose. ?Some side effects of medications discussed. Mild bradycardia  since diltiazem was added, instructed about warning signs. She will continue monitoring HR at home. ?Low salt diet to continue. ? ?Stage 3b chronic kidney disease ([place]) ?Problem has been stable. ?Continue low salt diet,adequate hydration,and avoidance of NSAID's. ?Continue Lisinopril. ?Has appt with nephrologist in 12/2021. ? ?Atherosclerosis of aorta ([place]) ?Continue Atorvastatin 40 mg daily. ?She is not fasting today, will check FLP next visit. ? ?Return in about 6 months (around 05/05/2022). ? ?Tanay Misuraca G. Martinique, MD ? ?[place]. ?[place] office. ? ?

## 2021-11-03 ENCOUNTER — Encounter: Payer: Self-pay | Admitting: Family Medicine

## 2021-11-03 ENCOUNTER — Ambulatory Visit (INDEPENDENT_AMBULATORY_CARE_PROVIDER_SITE_OTHER): Payer: Medicare Other | Admitting: Family Medicine

## 2021-11-03 VITALS — BP 120/64 | HR 97 | Resp 16 | Ht 61.0 in | Wt 134.1 lb

## 2021-11-03 DIAGNOSIS — I10 Essential (primary) hypertension: Secondary | ICD-10-CM | POA: Diagnosis not present

## 2021-11-03 DIAGNOSIS — I700 Atherosclerosis of aorta: Secondary | ICD-10-CM

## 2021-11-03 DIAGNOSIS — N1832 Chronic kidney disease, stage 3b: Secondary | ICD-10-CM | POA: Diagnosis not present

## 2021-11-03 NOTE — Patient Instructions (Signed)
A few things to remember from today's visit: ? ? ?Essential hypertension - Plan: Basic metabolic panel ? ?Stage 3b chronic kidney disease ([place]) ? ?If you need refills please call your pharmacy. ?Do not use My Chart to request refills or for acute issues that need immediate attention. ?  ?No changes today. ?I will see you back in 6 months, before if needed. ? ?Please be sure medication list is accurate. ?If a new problem present, please set up appointment sooner than planned today. ? ? ?

## 2021-11-04 LAB — BASIC METABOLIC PANEL
BUN: 26 mg/dL — ABNORMAL HIGH (ref 6–23)
CO2: 28 mEq/L (ref 19–32)
Calcium: 9.2 mg/dL (ref 8.4–10.5)
Chloride: 99 mEq/L (ref 96–112)
Creatinine, Ser: 1.28 mg/dL — ABNORMAL HIGH (ref 0.40–1.20)
GFR: 38.29 mL/min — ABNORMAL LOW (ref >60.00)
Glucose, Bld: 122 mg/dL — ABNORMAL HIGH (ref 70–99)
Potassium: 3.5 mEq/L (ref 3.5–5.1)
Sodium: 135 mEq/L (ref 135–145)

## 2021-11-16 DIAGNOSIS — H35372 Puckering of macula, left eye: Secondary | ICD-10-CM | POA: Diagnosis not present

## 2021-11-16 DIAGNOSIS — H25013 Cortical age-related cataract, bilateral: Secondary | ICD-10-CM | POA: Diagnosis not present

## 2021-11-16 DIAGNOSIS — H353121 Nonexudative age-related macular degeneration, left eye, early dry stage: Secondary | ICD-10-CM | POA: Diagnosis not present

## 2021-11-16 DIAGNOSIS — H2513 Age-related nuclear cataract, bilateral: Secondary | ICD-10-CM | POA: Diagnosis not present

## 2021-11-17 ENCOUNTER — Other Ambulatory Visit: Payer: Self-pay | Admitting: Family Medicine

## 2021-11-17 DIAGNOSIS — G4700 Insomnia, unspecified: Secondary | ICD-10-CM

## 2021-12-22 ENCOUNTER — Other Ambulatory Visit: Payer: Self-pay | Admitting: Family Medicine

## 2021-12-22 DIAGNOSIS — I10 Essential (primary) hypertension: Secondary | ICD-10-CM

## 2021-12-26 ENCOUNTER — Other Ambulatory Visit: Payer: Self-pay | Admitting: Family Medicine

## 2021-12-26 DIAGNOSIS — K219 Gastro-esophageal reflux disease without esophagitis: Secondary | ICD-10-CM

## 2022-01-05 DIAGNOSIS — E785 Hyperlipidemia, unspecified: Secondary | ICD-10-CM | POA: Diagnosis not present

## 2022-01-05 DIAGNOSIS — I129 Hypertensive chronic kidney disease with stage 1 through stage 4 chronic kidney disease, or unspecified chronic kidney disease: Secondary | ICD-10-CM | POA: Diagnosis not present

## 2022-01-05 DIAGNOSIS — N1832 Chronic kidney disease, stage 3b: Secondary | ICD-10-CM | POA: Diagnosis not present

## 2022-01-05 DIAGNOSIS — N390 Urinary tract infection, site not specified: Secondary | ICD-10-CM | POA: Diagnosis not present

## 2022-01-11 ENCOUNTER — Encounter: Payer: Self-pay | Admitting: Family Medicine

## 2022-01-22 ENCOUNTER — Other Ambulatory Visit: Payer: Self-pay | Admitting: Family Medicine

## 2022-01-22 DIAGNOSIS — I10 Essential (primary) hypertension: Secondary | ICD-10-CM

## 2022-03-21 ENCOUNTER — Other Ambulatory Visit: Payer: Self-pay | Admitting: Family Medicine

## 2022-03-21 DIAGNOSIS — G4700 Insomnia, unspecified: Secondary | ICD-10-CM

## 2022-03-29 DIAGNOSIS — Z85828 Personal history of other malignant neoplasm of skin: Secondary | ICD-10-CM | POA: Diagnosis not present

## 2022-03-29 DIAGNOSIS — L821 Other seborrheic keratosis: Secondary | ICD-10-CM | POA: Diagnosis not present

## 2022-03-29 DIAGNOSIS — L570 Actinic keratosis: Secondary | ICD-10-CM | POA: Diagnosis not present

## 2022-04-14 DIAGNOSIS — M5416 Radiculopathy, lumbar region: Secondary | ICD-10-CM | POA: Diagnosis not present

## 2022-04-14 DIAGNOSIS — G894 Chronic pain syndrome: Secondary | ICD-10-CM | POA: Diagnosis not present

## 2022-05-01 DIAGNOSIS — Z23 Encounter for immunization: Secondary | ICD-10-CM | POA: Diagnosis not present

## 2022-05-12 DIAGNOSIS — G894 Chronic pain syndrome: Secondary | ICD-10-CM | POA: Diagnosis not present

## 2022-05-12 DIAGNOSIS — M5416 Radiculopathy, lumbar region: Secondary | ICD-10-CM | POA: Diagnosis not present

## 2022-05-17 ENCOUNTER — Ambulatory Visit (INDEPENDENT_AMBULATORY_CARE_PROVIDER_SITE_OTHER): Payer: Medicare Other

## 2022-05-17 VITALS — Ht 61.0 in | Wt 130.0 lb

## 2022-05-17 DIAGNOSIS — Z0000 Encounter for general adult medical examination without abnormal findings: Secondary | ICD-10-CM

## 2022-05-17 NOTE — Progress Notes (Signed)
I connected with Amber Baird today by telephone and verified that I am speaking with the correct person using two identifiers. Location patient: home Location provider: work Persons participating in the virtual visit: Amber Baird, Amber Durand LPN.   I discussed the limitations, risks, security and privacy concerns of performing an evaluation and management service by telephone and the availability of in person appointments. I also discussed with the patient that there may be a patient responsible charge related to this service. The patient expressed understanding and verbally consented to this telephonic visit.    Interactive audio and video telecommunications were attempted between this provider and patient, however failed, due to patient having technical difficulties OR patient did not have access to video capability.  We continued and completed visit with audio only.     Vital signs may be patient reported or missing.  Subjective:   Amber Baird is a 87 y.o. female who presents for Medicare Annual (Subsequent) preventive examination.  Review of Systems     Cardiac Risk Factors include: advanced age (>73mn, >>47women);dyslipidemia;hypertension     Objective:    Today's Vitals   05/17/22 1001  Weight: 130 lb (59 kg)  Height: '5\' 1"'$  (1.549 m)   Body mass index is 24.56 kg/m.     05/17/2022   10:06 AM 05/15/2021   10:52 AM 05/14/2020   10:45 AM 01/27/2018    8:34 AM 05/20/2016    8:28 AM 05/20/2016    8:22 AM 01/23/2016    7:02 PM  Advanced Directives  Does Patient Have a Medical Advance Directive? Yes Yes Yes Yes Yes Yes Yes  Type of [place]     Does patient want to make changes to medical advance directive?   No - Patient declined      [place]? No - copy requested No - copy requested No - copy requested  No - copy  requested No - copy requested     Current Medications (verified) Outpatient Encounter Medications as of 05/17/2022  Medication Sig   alendronate (FOSAMAX) 70 MG tablet Take 1 tablet (70 mg total) by mouth every 7 (seven) days. Take with a full glass of water on an empty stomach.   atorvastatin (LIPITOR) 40 MG tablet TAKE 1 TABLET DAILY   Biotin 5 MG CAPS Take 1 capsule by mouth daily.   diltiazem (CARDIZEM) 30 MG tablet TAKE 1 TABLET(30 MG) BY MOUTH THREE TIMES DAILY   gabapentin (NEURONTIN) 300 MG capsule TAKE 1 CAPSULE(300 MG) BY MOUTH EVERY EVENING   hydrochlorothiazide (HYDRODIURIL) 25 MG tablet TAKE 1 TABLET DAILY   lisinopril (ZESTRIL) 30 MG tablet TAKE 1 TABLET(30 MG) BY MOUTH DAILY   Magnesium 400 MG CAPS Take 400 mg by mouth daily.   omeprazole (PRILOSEC) 20 MG capsule TAKE 1 CAPSULE DAILY   Multiple Vitamin (MULTIVITAMIN) tablet Take 1 tablet by mouth daily. (Patient not taking: Reported on 05/17/2022)   No facility-administered encounter medications on file as of 05/17/2022.    Allergies (verified) Carvedilol, Atenolol, Baclofen, Hydralazine hcl, Oxycodone, Sulfa antibiotics, Adhesive [tape], and Amlodipine   History: Past Medical History:  Diagnosis Date   Bullous pemphigoid    Hypertension    Past Surgical History:  Procedure Laterality Date   [place]  Family  History  Problem Relation Age of Onset   Arthritis Other    High Cholesterol Mother    Heart disease Mother    Hypertension Mother    Social History   Socioeconomic History   Marital status: Widowed    Spouse name: Not on file   Number of children: 1   Years of education: Not on file   Highest education level: Not on file  Occupational History   Not on file  Tobacco Use   Smoking status: Never   Smokeless tobacco: Never  Vaping Use   Vaping Use: Never used  Substance and Sexual Activity   Alcohol use: No     Alcohol/week: 0.0 standard drinks of alcohol   Drug use: No   Sexual activity: Not on file  Other Topics Concern   Not on file  Social History Narrative   Not on file   Social Determinants of Health   Financial Resource Strain: Low Risk  (05/17/2022)   Overall Financial Resource Strain (CARDIA)    Difficulty of Paying Living Expenses: Not hard at all  Food Insecurity: No Food Insecurity (05/17/2022)   Hunger Vital Sign    Worried About Running Out of Food in the Last Year: Never true    Ran Out of Food in the Last Year: Never true  Transportation Needs: No Transportation Needs (05/17/2022)   PRAPARE - Hydrologist (Medical): No    Lack of Transportation (Non-Medical): No  Physical Activity: Inactive (05/17/2022)   Exercise Vital Sign    Days of Exercise per Week: 0 days    Minutes of Exercise per Session: 0 min  Stress: No Stress Concern Present (05/17/2022)   [place]    Feeling of Stress : Not at all  Social Connections: Socially Isolated (05/15/2021)   Social Connection and Isolation Panel [NHANES]    Frequency of Communication with Friends and Family: Twice a week    Frequency of Social Gatherings with Friends and Family: Twice a week    Attends Religious Services: Never    Marine scientist or Organizations: No    Attends Archivist Meetings: Never    Marital Status: Widowed    Tobacco Counseling Counseling given: Not Answered   Clinical Intake:  Pre-visit preparation completed: Yes  Pain : No/denies pain     Nutritional Status: BMI of 19-24  Normal Nutritional Risks: None Diabetes: No  How often do you need to have someone help you when you read instructions, pamphlets, or other written materials from your doctor or pharmacy?: 1 - Never What is the last grade level you completed in school?: 71yr college  Diabetic? no  Interpreter Needed?:  No  Information entered by :: NAllen LPN   Activities of Daily Living    05/17/2022   10:07 AM  In your present state of health, do you have any difficulty performing the following activities:  Hearing? 1  Comment has hearing aids  Vision? 0  Difficulty concentrating or making decisions? 0  Walking or climbing stairs? 1  Comment due to back  Dressing or bathing? 0  Doing errands, shopping? 0  Preparing Food and eating ? N  Using the Toilet? N  In the past six months, have you accidently leaked urine? Y  Do you have problems with loss of bowel control? N  Managing your Medications? N  Managing your Finances? N  Housekeeping or managing your Housekeeping? N  Patient Care Team: Martinique, Betty G, MD as PCP - General (Family Medicine)  Indicate any recent Medical Services you may have received from other than [facility] providers in the past year (date may be approximate).     Assessment:   This is a routine wellness examination for [place].  Hearing/Vision screen Vision Screening - Comments:: Regular eye exams, Dr. Monna Fam  Dietary issues and exercise activities discussed: Current Exercise Habits: The patient does not participate in regular exercise at present   Goals Addressed             This Visit's Progress    Patient Stated       05/17/2022, complete move       Depression Screen    05/17/2022   10:07 AM 11/03/2021    3:14 PM 09/15/2021    1:57 PM 05/15/2021   10:53 AM 05/15/2021   10:50 AM 05/14/2020   10:48 AM 03/12/2019   12:06 PM  PHQ 2/9 Scores  PHQ - 2 Score 0 0 0 0 0 0 0  PHQ- 9 Score      0     Fall Risk    05/17/2022   10:07 AM 09/15/2021    1:57 PM 05/15/2021   10:53 AM 05/14/2020   10:46 AM 03/12/2019   12:06 PM  Fall Risk   Falls in the past year? 0 0 0 0 0  Number falls in past yr: 0 0 0 0 0  Injury with Fall? 0 0 0 0 0  Risk for fall due to : Medication side effect No Fall Risks  Orthopedic patient   Follow up Falls prevention  discussed;Education provided;Falls evaluation completed Falls evaluation completed Falls evaluation completed Falls evaluation completed;Falls prevention discussed Education provided    FALL RISK PREVENTION PERTAINING TO THE HOME:  Any stairs in or around the home? No  If so, are there any without handrails? N/a Home free of loose throw rugs in walkways, pet beds, electrical cords, etc? Yes  Adequate lighting in your home to reduce risk of falls? Yes   ASSISTIVE DEVICES UTILIZED TO PREVENT FALLS:  Life alert? No  Use of a cane, walker or w/c? No  Grab bars in the bathroom? No  Shower chair or bench in shower? Yes  Elevated toilet seat or a handicapped toilet? Yes   TIMED UP AND GO:  Was the test performed? No .      Cognitive Function:        05/17/2022   10:09 AM 05/14/2020   10:50 AM  6CIT Screen  What Year? 0 points 0 points  What month? 0 points 0 points  What time? 0 points 0 points  Count back from 20 0 points 0 points  Months in reverse 0 points 0 points  Repeat phrase 0 points 2 points  Total Score 0 points 2 points    Immunizations Immunization History  Administered Date(s) Administered   Fluad Quad(high Dose 65+) 04/21/2020   Influenza, High Dose Seasonal PF 05/20/2016, 05/31/2018   Influenza-Unspecified 05/20/2016, 05/26/2017, 05/13/2021   PFIZER(Purple Top)SARS-COV-2 Vaccination 08/25/2019, 09/15/2019, 04/28/2020, 04/28/2020, 04/15/2021   Pneumococcal Conjugate-13 08/02/2013   Pneumococcal Polysaccharide-23 08/02/2008, 07/20/2013   Tdap 10/07/2016   Unspecified SARS-COV-2 Vaccination 05/01/2022   Zoster Recombinat (Shingrix) 12/09/2016, 03/21/2017   Zoster, Live 08/03/2003, 12/09/2016    TDAP status: Up to date  Flu Vaccine status: Up to date  Pneumococcal vaccine status: Up to date  Covid-19 vaccine status: Completed vaccines  Qualifies for Shingles  Vaccine? Yes   Zostavax completed Yes   Shingrix Completed?: Yes  Screening  Tests Health Maintenance  Topic Date Due   INFLUENZA VACCINE  03/02/2022   TETANUS/TDAP  10/08/2026   Pneumonia Vaccine 76+ Years old  Completed   DEXA SCAN  Completed   COVID-19 Vaccine  Completed   Zoster Vaccines- Shingrix  Completed   HPV VACCINES  Aged Out    Health Maintenance  Health Maintenance Due  Topic Date Due   INFLUENZA VACCINE  03/02/2022    Colorectal cancer screening: No longer required.   Mammogram status: No longer required due to age.  Bone Density status: Completed 07/03/2021.  Lung Cancer Screening: (Low Dose CT Chest recommended if Age 19-80 years, 30 pack-year currently smoking OR have quit w/in 15years.) does not qualify.   Lung Cancer Screening Referral: no  Additional Screening:  Hepatitis C Screening: does not qualify;   Vision Screening: Recommended annual ophthalmology exams for early detection of glaucoma and other disorders of the eye. Is the patient up to date with their annual eye exam?  Yes  Who is the provider or what is the name of the office in which the patient attends annual eye exams? Dr. Herbert Deaner If pt is not established with a provider, would they like to be referred to a provider to establish care? No .   Dental Screening: Recommended annual dental exams for proper oral hygiene  Community Resource Referral / Chronic Care Management: CRR required this visit?  No   CCM required this visit?  No      Plan:     I have personally reviewed and noted the following in the patient's chart:   Medical and social history Use of alcohol, tobacco or illicit drugs  Current medications and supplements including opioid prescriptions. Patient is not currently taking opioid prescriptions. Functional ability and status Nutritional status Physical activity Advanced directives List of other physicians Hospitalizations, surgeries, and ER visits in previous 12 months Vitals Screenings to include cognitive, depression, and falls Referrals  and appointments  In addition, I have reviewed and discussed with patient certain preventive protocols, quality metrics, and best practice recommendations. A written personalized care plan for preventive services as well as general preventive health recommendations were provided to patient.     Kellie Simmering, LPN   [id number]   Nurse Notes: none  Due to this being a virtual visit, the after visit summary with patients personalized plan was offered to patient via mail or my-chart. Patient would like to access on my-chart

## 2022-05-17 NOTE — Patient Instructions (Signed)
Amber Baird , Thank you for taking time to come for your Medicare Wellness Visit. I appreciate your ongoing commitment to your health goals. Please review the following plan we discussed and let me know if I can assist you in the future.   Screening recommendations/referrals: Colonoscopy: not required Mammogram: not required Bone Density: completed  Recommended yearly ophthalmology/optometry visit for glaucoma screening and checkup Recommended yearly dental visit for hygiene and checkup  Vaccinations: Influenza vaccine: completed per patient Pneumococcal vaccine: completed 08/02/2013 Tdap vaccine: completed 10/07/2016, due 10/08/2026 Shingles vaccine: completed   Covid-19: 05/01/2022, 04/15/2021, 11/10/2020, 04/28/2020, 09/15/2019, 08/25/2019  Advanced directives: Please bring a copy of your POA (Power of Attorney) and/or Living Will to your next appointment.   Conditions/risks identified: none  Next appointment: Follow up in one year for your annual wellness visit    Preventive Care 65 Years and Older, Female Preventive care refers to lifestyle choices and visits with your health care provider that can promote health and wellness. What does preventive care include? A yearly physical exam. This is also called an annual well check. Dental exams once or twice a year. Routine eye exams. Ask your health care provider how often you should have your eyes checked. Personal lifestyle choices, including: Daily care of your teeth and gums. Regular physical activity. Eating a healthy diet. Avoiding tobacco and drug use. Limiting alcohol use. Practicing safe sex. Taking low-dose aspirin every day. Taking vitamin and mineral supplements as recommended by your health care provider. What happens during an annual well check? The services and screenings done by your health care provider during your annual well check will depend on your age, overall health, lifestyle risk factors, and family history of  disease. Counseling  Your health care provider may ask you questions about your: Alcohol use. Tobacco use. Drug use. Emotional well-being. Home and relationship well-being. Sexual activity. Eating habits. History of falls. Memory and ability to understand (cognition). Work and work Statistician. Reproductive health. Screening  You may have the following tests or measurements: Height, weight, and BMI. Blood pressure. Lipid and cholesterol levels. These may be checked every 5 years, or more frequently if you are over 81 years old. Skin check. Lung cancer screening. You may have this screening every year starting at age 3 if you have a 30-pack-year history of smoking and currently smoke or have quit within the past 15 years. Fecal occult blood test (FOBT) of the stool. You may have this test every year starting at age 52. Flexible sigmoidoscopy or colonoscopy. You may have a sigmoidoscopy every 5 years or a colonoscopy every 10 years starting at age 68. Hepatitis C blood test. Hepatitis B blood test. Sexually transmitted disease (STD) testing. Diabetes screening. This is done by checking your blood sugar (glucose) after you have not eaten for a while (fasting). You may have this done every 1-3 years. Bone density scan. This is done to screen for osteoporosis. You may have this done starting at age 38. Mammogram. This may be done every 1-2 years. Talk to your health care provider about how often you should have regular mammograms. Talk with your health care provider about your test results, treatment options, and if necessary, the need for more tests. Vaccines  Your health care provider may recommend certain vaccines, such as: Influenza vaccine. This is recommended every year. Tetanus, diphtheria, and acellular pertussis (Tdap, Td) vaccine. You may need a Td booster every 10 years. Zoster vaccine. You may need this after age 27. Pneumococcal 13-valent conjugate (  PCV13) vaccine. One  dose is recommended after age 4. Pneumococcal polysaccharide (PPSV23) vaccine. One dose is recommended after age 90. Talk to your health care provider about which screenings and vaccines you need and how often you need them. This information is not intended to replace advice given to you by your health care provider. Make sure you discuss any questions you have with your health care provider. Document Released: 08/15/2015 Document Revised: 04/07/2016 Document Reviewed: 05/20/2015 Elsevier Interactive Patient Education  2017 [place] Prevention in the Home Falls can cause injuries. They can happen to people of all ages. There are many things you can do to make your home safe and to help prevent falls. What can I do on the outside of my home? Regularly fix the edges of walkways and driveways and fix any cracks. Remove anything that might make you trip as you walk through a door, such as a raised step or threshold. Trim any bushes or trees on the path to your home. Use bright outdoor lighting. Clear any walking paths of anything that might make someone trip, such as rocks or tools. Regularly check to see if handrails are loose or broken. Make sure that both sides of any steps have handrails. Any raised decks and porches should have guardrails on the edges. Have any leaves, snow, or ice cleared regularly. Use sand or salt on walking paths during winter. Clean up any spills in your garage right away. This includes oil or grease spills. What can I do in the bathroom? Use night lights. Install grab bars by the toilet and in the tub and shower. Do not use towel bars as grab bars. Use non-skid mats or decals in the tub or shower. If you need to sit down in the shower, use a plastic, non-slip stool. Keep the floor dry. Clean up any water that spills on the floor as soon as it happens. Remove soap buildup in the tub or shower regularly. Attach bath mats securely with double-sided  non-slip rug tape. Do not have throw rugs and other things on the floor that can make you trip. What can I do in the bedroom? Use night lights. Make sure that you have a light by your bed that is easy to reach. Do not use any sheets or blankets that are too big for your bed. They should not hang down onto the floor. Have a firm chair that has side arms. You can use this for support while you get dressed. Do not have throw rugs and other things on the floor that can make you trip. What can I do in the kitchen? Clean up any spills right away. Avoid walking on wet floors. Keep items that you use a lot in easy-to-reach places. If you need to reach something above you, use a strong step stool that has a grab bar. Keep electrical cords out of the way. Do not use floor polish or wax that makes floors slippery. If you must use wax, use non-skid floor wax. Do not have throw rugs and other things on the floor that can make you trip. What can I do with my stairs? Do not leave any items on the stairs. Make sure that there are handrails on both sides of the stairs and use them. Fix handrails that are broken or loose. Make sure that handrails are as long as the stairways. Check any carpeting to make sure that it is firmly attached to the stairs. Fix any carpet that is  loose or worn. Avoid having throw rugs at the top or bottom of the stairs. If you do have throw rugs, attach them to the floor with carpet tape. Make sure that you have a light switch at the top of the stairs and the bottom of the stairs. If you do not have them, ask someone to add them for you. What else can I do to help prevent falls? Wear shoes that: Do not have high heels. Have rubber bottoms. Are comfortable and fit you well. Are closed at the toe. Do not wear sandals. If you use a stepladder: Make sure that it is fully opened. Do not climb a closed stepladder. Make sure that both sides of the stepladder are locked into place. Ask  someone to hold it for you, if possible. Clearly mark and make sure that you can see: Any grab bars or handrails. First and last steps. Where the edge of each step is. Use tools that help you move around (mobility aids) if they are needed. These include: Canes. Walkers. Scooters. Crutches. Turn on the lights when you go into a dark area. Replace any light bulbs as soon as they burn out. Set up your furniture so you have a clear path. Avoid moving your furniture around. If any of your floors are uneven, fix them. If there are any pets around you, be aware of where they are. Review your medicines with your doctor. Some medicines can make you feel dizzy. This can increase your chance of falling. Ask your doctor what other things that you can do to help prevent falls. This information is not intended to replace advice given to you by your health care provider. Make sure you discuss any questions you have with your health care provider. Document Released: 05/15/2009 Document Revised: 12/25/2015 Document Reviewed: 08/23/2014 Elsevier Interactive Patient Education  2017 [employer].

## 2022-05-30 ENCOUNTER — Other Ambulatory Visit: Payer: Self-pay | Admitting: Family Medicine

## 2022-05-30 DIAGNOSIS — E7800 Pure hypercholesterolemia, unspecified: Secondary | ICD-10-CM

## 2022-06-16 DIAGNOSIS — G894 Chronic pain syndrome: Secondary | ICD-10-CM | POA: Diagnosis not present

## 2022-06-16 DIAGNOSIS — M5416 Radiculopathy, lumbar region: Secondary | ICD-10-CM | POA: Diagnosis not present

## 2022-06-18 ENCOUNTER — Telehealth: Payer: Self-pay | Admitting: Family Medicine

## 2022-06-18 NOTE — Telephone Encounter (Signed)
On pcp's desk to be signed.

## 2022-06-18 NOTE — Telephone Encounter (Signed)
Pt dropped off Wellness forms that she needs to turn into her assisting living home that she has been transferred to. Pt would like forms to be faxed to the number provided.  Please advise.

## 2022-06-19 ENCOUNTER — Other Ambulatory Visit: Payer: Self-pay | Admitting: Family Medicine

## 2022-06-19 DIAGNOSIS — I10 Essential (primary) hypertension: Secondary | ICD-10-CM

## 2022-06-23 ENCOUNTER — Other Ambulatory Visit: Payer: Self-pay | Admitting: Rehabilitation

## 2022-06-23 DIAGNOSIS — M5416 Radiculopathy, lumbar region: Secondary | ICD-10-CM

## 2022-06-26 ENCOUNTER — Other Ambulatory Visit: Payer: Self-pay | Admitting: Family Medicine

## 2022-06-28 NOTE — Telephone Encounter (Signed)
Have tried to fax form multiple times with busy signal. Called and spoke with [facility] but they didn't have another fax number. I called and spoke with patient, she will come pick up the hard copy.

## 2022-06-28 NOTE — Telephone Encounter (Signed)
Form faxed & copy sent to scan.

## 2022-07-12 ENCOUNTER — Ambulatory Visit
Admission: RE | Admit: 2022-07-12 | Discharge: 2022-07-12 | Disposition: A | Discharge status: Home / Self Care | Payer: Medicare Other | Source: Ambulatory Visit | Attending: Rehabilitation | Admitting: Rehabilitation

## 2022-07-12 DIAGNOSIS — M545 Low back pain, unspecified: Secondary | ICD-10-CM | POA: Diagnosis not present

## 2022-07-12 DIAGNOSIS — M5416 Radiculopathy, lumbar region: Secondary | ICD-10-CM

## 2022-07-12 DIAGNOSIS — M4807 Spinal stenosis, lumbosacral region: Secondary | ICD-10-CM | POA: Diagnosis not present

## 2022-07-12 DIAGNOSIS — M48061 Spinal stenosis, lumbar region without neurogenic claudication: Secondary | ICD-10-CM | POA: Diagnosis not present

## 2022-07-14 DIAGNOSIS — M5416 Radiculopathy, lumbar region: Secondary | ICD-10-CM | POA: Diagnosis not present

## 2022-07-14 DIAGNOSIS — M47816 Spondylosis without myelopathy or radiculopathy, lumbar region: Secondary | ICD-10-CM | POA: Diagnosis not present

## 2022-07-14 DIAGNOSIS — G894 Chronic pain syndrome: Secondary | ICD-10-CM | POA: Diagnosis not present

## 2022-07-14 DIAGNOSIS — M4125 Other idiopathic scoliosis, thoracolumbar region: Secondary | ICD-10-CM | POA: Diagnosis not present

## 2022-08-18 DIAGNOSIS — M47816 Spondylosis without myelopathy or radiculopathy, lumbar region: Secondary | ICD-10-CM | POA: Diagnosis not present

## 2022-08-31 DIAGNOSIS — M47816 Spondylosis without myelopathy or radiculopathy, lumbar region: Secondary | ICD-10-CM | POA: Diagnosis not present

## 2022-09-08 DIAGNOSIS — M47816 Spondylosis without myelopathy or radiculopathy, lumbar region: Secondary | ICD-10-CM | POA: Diagnosis not present

## 2022-09-08 DIAGNOSIS — G894 Chronic pain syndrome: Secondary | ICD-10-CM | POA: Diagnosis not present

## 2022-09-13 ENCOUNTER — Other Ambulatory Visit: Payer: Self-pay | Admitting: Family Medicine

## 2022-09-13 DIAGNOSIS — I10 Essential (primary) hypertension: Secondary | ICD-10-CM

## 2022-09-23 NOTE — Progress Notes (Unsigned)
HPI: [name] is a 88 y.o. female, who is here today for chronic disease management.  Last seen on 11/03/21.  Hypertension: Currently she is on HCTZ 25 mg daily, lisinopril 30 mg daily, and diltiazem 30 mg 3 times daily. Negative for unusual or severe headache, visual changes, exertional chest pain, dyspnea,  focal weakness, or edema. CKD III: Follows with nephrologist annually, last visit in 12/2021. Negative for gross hematuria, foam in urine, or decreased urine output.  Lab Results  Component Value Date   CREATININE 1.28 (H) 11/03/2021   BUN 26 (H) 11/03/2021   NA 135 11/03/2021   K 3.5 11/03/2021   CL 99 11/03/2021   CO2 28 11/03/2021   HLD and aortic atherosclerosis seen on abdominal CTA in6/2017. She is on atorvastatin 40 mg daily. She has tolerated medication well.  Lab Results  Component Value Date   CHOL 164 11/18/2020   HDL 54.70 11/18/2020   LDLCALC 81 11/18/2020   TRIG 139.0 11/18/2020   CHOLHDL 3 11/18/2020   Lab Results  Component Value Date   ALT 22 09/04/2018   AST 18 09/04/2018   ALKPHOS 72 09/04/2018   BILITOT 0.7 09/04/2018   She also mentions having difficulty hearing due to ear wax buildup, particularly in her right ear. She recently had her hearing aids checked and cleaned and according to pt, she was told it was cerumen impaction causing hearing loss.  Insomnia: Gabapentin 300 mg has helped. She tried Gabapentin 300 mg tid for lower back pain, did not help. Sleeping better since started new med for back pain.  Chronic lower back pain, since her last visit she has been started on buprenorphine 75 mg daily.  Review of Systems  Constitutional:  Negative for chills and fever.  HENT:  Positive for hearing loss. Negative for congestion, ear discharge, ear pain, rhinorrhea and sore throat.   Respiratory:  Negative for cough and wheezing.   Gastrointestinal:  Negative for abdominal pain, nausea and vomiting.  Musculoskeletal:  Negative  for back pain.  Skin:  Negative for rash.  Neurological:  Negative for syncope and facial asymmetry.  See other pertinent positives and negatives in HPI.  Current Outpatient Medications on File Prior to Visit  Medication Sig Dispense Refill   alendronate (FOSAMAX) 70 MG tablet TAKE 1 TABLET(70 MG) BY MOUTH EVERY 7 DAYS WITH A FULL GLASS OF WATER AND ON AN EMPTY STOMACH 4 tablet 11   atorvastatin (LIPITOR) 40 MG tablet TAKE 1 TABLET DAILY 90 tablet 1   Biotin 5 MG CAPS Take 1 capsule by mouth daily.     Buprenorphine HCl (BELBUCA) 75 MCG FILM Place 75 mcg inside cheek daily.     diltiazem (CARDIZEM) 30 MG tablet TAKE 1 TABLET(30 MG) BY MOUTH THREE TIMES DAILY 270 tablet 1   gabapentin (NEURONTIN) 300 MG capsule TAKE 1 CAPSULE(300 MG) BY MOUTH EVERY EVENING 30 capsule 3   hydrochlorothiazide (HYDRODIURIL) 25 MG tablet TAKE 1 TABLET DAILY 90 tablet 1   lisinopril (ZESTRIL) 30 MG tablet TAKE 1 TABLET(30 MG) BY MOUTH DAILY 90 tablet 2   Magnesium 400 MG CAPS Take 400 mg by mouth daily.     Multiple Vitamin (MULTIVITAMIN) tablet Take 1 tablet by mouth daily.     omeprazole (PRILOSEC) 20 MG capsule TAKE 1 CAPSULE DAILY 90 capsule 3   No current facility-administered medications on file prior to visit.    Past Medical History:  Diagnosis Date   Bullous pemphigoid    Hypertension  Allergies  Allergen Reactions   Carvedilol Diarrhea   Atenolol Other (See Comments)    Drops heart rate   Baclofen Other (See Comments)    Stroke like symptoms    Hydralazine Hcl Swelling and Other (See Comments)    Swelling on face, arms red from elbow to fingers.   Oxycodone Other (See Comments)    Confusion    Sulfa Antibiotics Other (See Comments)    Childhood allergy    Adhesive [Tape] Rash   Amlodipine Rash and Other (See Comments)    flushing    Social History   Socioeconomic History   Marital status: Widowed    Spouse name: Not on file   Number of children: 1   Years of education: Not on  file   Highest education level: Not on file  Occupational History   Not on file  Tobacco Use   Smoking status: Never   Smokeless tobacco: Never  Vaping Use   Vaping Use: Never used  Substance and Sexual Activity   Alcohol use: No    Alcohol/week: 0.0 standard drinks of alcohol   Drug use: No   Sexual activity: Not on file  Other Topics Concern   Not on file  Social History Narrative   Not on file   Social Determinants of Health   Financial Resource Strain: Low Risk  (05/17/2022)   Overall Financial Resource Strain (CARDIA)    Difficulty of Paying Living Expenses: Not hard at all  Food Insecurity: No Food Insecurity (05/17/2022)   Hunger Vital Sign    Worried About Running Out of Food in the Last Year: Never true    Ran Out of Food in the Last Year: Never true  Transportation Needs: No Transportation Needs (05/17/2022)   PRAPARE - Hydrologist (Medical): No    Lack of Transportation (Non-Medical): No  Physical Activity: Inactive (05/17/2022)   Exercise Vital Sign    Days of Exercise per Week: 0 days    Minutes of Exercise per Session: 0 min  Stress: No Stress Concern Present (05/17/2022)   [place]    Feeling of Stress : Not at all  Social Connections: Socially Isolated (05/15/2021)   Social Connection and Isolation Panel [NHANES]    Frequency of Communication with Friends and Family: Twice a week    Frequency of Social Gatherings with Friends and Family: Twice a week    Attends Religious Services: Never    Marine scientist or Organizations: No    Attends Archivist Meetings: Never    Marital Status: Widowed   Vitals:   09/24/22 1412  BP: 118/60  Pulse: 82  Resp: 16  SpO2: 99%   Body mass index is 25.15 kg/m.  Physical Exam Vitals and nursing note reviewed.  Constitutional:      General: She is not in acute distress.    Appearance: She is  well-developed.  HENT:     Head: Normocephalic and atraumatic.     Right Ear: External ear normal.     Left Ear: Tympanic membrane and external ear normal.     Ears:     Comments: Right ear canal with cerumen impaction. Left ear canal with excess cerumen but not impacted, I can see TM partially. Hearing aids.    Mouth/Throat:     Mouth: Mucous membranes are moist.     Pharynx: Oropharynx is clear.  Eyes:     Conjunctiva/sclera:  Conjunctivae normal.  Cardiovascular:     Rate and Rhythm: Normal rate and regular rhythm.     Heart sounds: Murmur (SEM I/VI RUSB) heard.  Pulmonary:     Effort: Pulmonary effort is normal. No respiratory distress.     Breath sounds: Normal breath sounds.  Abdominal:     Palpations: Abdomen is soft. There is no hepatomegaly or mass.     Tenderness: There is no abdominal tenderness.  Musculoskeletal:     Thoracic back: Scoliosis present.  Lymphadenopathy:     Cervical: No cervical adenopathy.  Skin:    General: Skin is warm.     Findings: No erythema or rash.  Neurological:     General: No focal deficit present.     Mental Status: She is alert and oriented to person, place, and time.     Comments: Antalgic gait not assisted.  Psychiatric:        Mood and Affect: Mood and affect normal.   ASSESSMENT AND PLAN:  Ms. Avarey was seen today for medical management of chronic issues.  Diagnoses and all orders for this visit: Lab Results  Component Value Date   CREATININE 1.24 (H) 09/24/2022   BUN 31 (H) 09/24/2022   NA 140 09/24/2022   K 3.7 09/24/2022   CL 99 09/24/2022   CO2 22 09/24/2022   Lab Results  Component Value Date   ALT 15 09/24/2022   AST 17 09/24/2022   ALKPHOS 61 09/24/2022   BILITOT 0.4 09/24/2022  Stage 3b chronic kidney disease ([place]) Assessment & Plan: Problem has been stable. Adequate BP control and hydration, low salt diet, avoidance NDAID's. Follows with nephrologist, next appt in 01/2023.  Orders: -     Basic  metabolic panel; Future  Atherosclerosis of aorta [facility]) Assessment & Plan: Seen on abdominal CTA in 01/2016. Continue Atorvastatin 40 mg daily.   Essential hypertension Assessment & Plan: BP adequately controlled. Continue Diltiazem 30 mg tid , HCTZ 25 mg daily,and Lisinopril 30 mg daily. Continue low salt diet. Eye exam is current.  Orders: -     Basic metabolic panel; Future -     Hepatic function panel; Future  Insomnia, unspecified type Assessment & Plan: Problem has improved starting buprenorphine to help with back pain. Continue gabapentin 300 mg at bedtime and continue sleep hygiene. We discussed some side effects of medications.   Mixed hyperlipidemia Assessment & Plan: Atorvastatin 40 mg daily and low-fat diet. Today she is not fasting, so we will plan on fasting lipid panel at next visit.  Orders: -     Hepatic function panel; Future  Hearing loss of right ear due to cerumen impaction -     Ear Cerumen Removal  Sensorineural hearing loss with hearing aids, hearing loss getting worse, attributed to cerumen impaction. After verbal consent and discussion of pros and cons of procedure, she underwent irrigation, which was not successful. I did remove part of cerumen with curette.  Ear Cerumen Removal  Date/Time: 09/25/2022 6:34 PM  Performed by: [place], Nasri Boakye G, MD Authorized by: [place]ique, [place] Holian G, MD   Anesthesia: Local Anesthetic: none Ceruminolytics applied: Ceruminolytics applied prior to the procedure. Location details: right ear Patient tolerance: patient tolerated the procedure well with no immediate complications Comments: Removed small amount of cerumen,which was enough to improve hearing. Procedure type: curette  Sedation: Patient sedated: no    Return in about 10 months (around 07/25/2023) for chronic problems, Labs.  Justa Hatchell G. [place], MD  [facility]. [place] office.

## 2022-09-24 ENCOUNTER — Encounter: Payer: Self-pay | Admitting: Family Medicine

## 2022-09-24 ENCOUNTER — Ambulatory Visit (INDEPENDENT_AMBULATORY_CARE_PROVIDER_SITE_OTHER): Payer: Medicare Other | Admitting: Family Medicine

## 2022-09-24 VITALS — BP 118/60 | HR 82 | Resp 16 | Ht 61.0 in | Wt 133.1 lb

## 2022-09-24 DIAGNOSIS — N1832 Chronic kidney disease, stage 3b: Secondary | ICD-10-CM | POA: Diagnosis not present

## 2022-09-24 DIAGNOSIS — E782 Mixed hyperlipidemia: Secondary | ICD-10-CM

## 2022-09-24 DIAGNOSIS — G4700 Insomnia, unspecified: Secondary | ICD-10-CM

## 2022-09-24 DIAGNOSIS — H6121 Impacted cerumen, right ear: Secondary | ICD-10-CM | POA: Diagnosis not present

## 2022-09-24 DIAGNOSIS — I700 Atherosclerosis of aorta: Secondary | ICD-10-CM

## 2022-09-24 DIAGNOSIS — I10 Essential (primary) hypertension: Secondary | ICD-10-CM | POA: Diagnosis not present

## 2022-09-24 NOTE — Patient Instructions (Addendum)
A few things to remember from today's visit:  Hearing loss of right ear due to cerumen impaction  Atherosclerosis of aorta ([facility]), Chronic  Stage 3b chronic kidney disease ([place]), Chronic  Essential hypertension  Insomnia, unspecified type  Mixed hyperlipidemia  Left ear does not have much wax, you can use over the counter wax drops. No changes today. Next visit we can do fasting labs, 07/2023.  If you need refills for medications you take chronically, please call your pharmacy. Do not use My Chart to request refills or for acute issues that need immediate attention. If you send a my chart message, it may take a few days to be addressed, specially if I am not in the office.  Please be sure medication list is accurate. If a new problem present, please set up appointment sooner than planned today.

## 2022-09-24 NOTE — Assessment & Plan Note (Addendum)
BP adequately controlled. Continue Diltiazem 30 mg tid , HCTZ 25 mg daily,and Lisinopril 30 mg daily. Continue low salt diet. Eye exam is current.

## 2022-09-25 DIAGNOSIS — H6121 Impacted cerumen, right ear: Secondary | ICD-10-CM | POA: Diagnosis not present

## 2022-09-25 LAB — HEPATIC FUNCTION PANEL
ALT: 15 IU/L (ref 0–32)
AST: 17 IU/L (ref 0–40)
Albumin: 4.1 g/dL (ref 3.7–4.7)
Alkaline Phosphatase: 61 IU/L (ref 44–121)
Bilirubin Total: 0.4 mg/dL (ref 0.0–1.2)
Bilirubin, Direct: 0.13 mg/dL (ref 0.00–0.40)
Total Protein: 5.9 g/dL — ABNORMAL LOW (ref 6.0–8.5)

## 2022-09-25 LAB — BASIC METABOLIC PANEL
BUN/Creatinine Ratio: 25 (ref 12–28)
BUN: 31 mg/dL — ABNORMAL HIGH (ref 8–27)
CO2: 22 mmol/L (ref 20–29)
Calcium: 9.5 mg/dL (ref 8.7–10.3)
Chloride: 99 mmol/L (ref 96–106)
Creatinine, Ser: 1.24 mg/dL — ABNORMAL HIGH (ref 0.57–1.00)
Glucose: 117 mg/dL — ABNORMAL HIGH (ref 70–99)
Potassium: 3.7 mmol/L (ref 3.5–5.2)
Sodium: 140 mmol/L (ref 134–144)
eGFR: 42 mL/min/{1.73_m2} — ABNORMAL LOW (ref >59)

## 2022-09-25 NOTE — Assessment & Plan Note (Signed)
Problem has been stable. Adequate BP control and hydration, low salt diet, avoidance NDAID's. Follows with nephrologist, next appt in 01/2023.

## 2022-09-25 NOTE — Assessment & Plan Note (Signed)
Atorvastatin 40 mg daily and low-fat diet. Today she is not fasting, so we will plan on fasting lipid panel at next visit.

## 2022-09-25 NOTE — Assessment & Plan Note (Signed)
Problem has improved starting buprenorphine to help with back pain. Continue gabapentin 300 mg at bedtime and continue sleep hygiene. We discussed some side effects of medications.

## 2022-09-25 NOTE — Assessment & Plan Note (Signed)
Seen on abdominal CTA in 01/2016. Continue Atorvastatin 40 mg daily.

## 2022-10-06 DIAGNOSIS — G894 Chronic pain syndrome: Secondary | ICD-10-CM | POA: Diagnosis not present

## 2022-10-06 DIAGNOSIS — M47816 Spondylosis without myelopathy or radiculopathy, lumbar region: Secondary | ICD-10-CM | POA: Diagnosis not present

## 2022-11-16 DIAGNOSIS — G894 Chronic pain syndrome: Secondary | ICD-10-CM | POA: Diagnosis not present

## 2022-11-16 DIAGNOSIS — M47816 Spondylosis without myelopathy or radiculopathy, lumbar region: Secondary | ICD-10-CM | POA: Diagnosis not present

## 2022-11-17 DIAGNOSIS — H2513 Age-related nuclear cataract, bilateral: Secondary | ICD-10-CM | POA: Diagnosis not present

## 2022-11-17 DIAGNOSIS — H47022 Hemorrhage in optic nerve sheath, left eye: Secondary | ICD-10-CM | POA: Diagnosis not present

## 2022-11-17 DIAGNOSIS — H40013 Open angle with borderline findings, low risk, bilateral: Secondary | ICD-10-CM | POA: Diagnosis not present

## 2022-11-17 DIAGNOSIS — H2512 Age-related nuclear cataract, left eye: Secondary | ICD-10-CM | POA: Diagnosis not present

## 2022-11-17 DIAGNOSIS — H25013 Cortical age-related cataract, bilateral: Secondary | ICD-10-CM | POA: Diagnosis not present

## 2022-11-30 DIAGNOSIS — H25812 Combined forms of age-related cataract, left eye: Secondary | ICD-10-CM | POA: Diagnosis not present

## 2022-11-30 DIAGNOSIS — H2512 Age-related nuclear cataract, left eye: Secondary | ICD-10-CM | POA: Diagnosis not present

## 2022-12-02 ENCOUNTER — Other Ambulatory Visit: Payer: Self-pay | Admitting: Family Medicine

## 2022-12-02 DIAGNOSIS — I10 Essential (primary) hypertension: Secondary | ICD-10-CM

## 2022-12-17 ENCOUNTER — Other Ambulatory Visit: Payer: Self-pay | Admitting: Family Medicine

## 2022-12-17 DIAGNOSIS — E7800 Pure hypercholesterolemia, unspecified: Secondary | ICD-10-CM

## 2022-12-29 ENCOUNTER — Other Ambulatory Visit: Payer: Self-pay | Admitting: Family Medicine

## 2022-12-29 DIAGNOSIS — K219 Gastro-esophageal reflux disease without esophagitis: Secondary | ICD-10-CM

## 2023-01-10 DIAGNOSIS — H25011 Cortical age-related cataract, right eye: Secondary | ICD-10-CM | POA: Diagnosis not present

## 2023-01-10 DIAGNOSIS — H2511 Age-related nuclear cataract, right eye: Secondary | ICD-10-CM | POA: Diagnosis not present

## 2023-01-17 DIAGNOSIS — G894 Chronic pain syndrome: Secondary | ICD-10-CM | POA: Diagnosis not present

## 2023-01-17 DIAGNOSIS — M47816 Spondylosis without myelopathy or radiculopathy, lumbar region: Secondary | ICD-10-CM | POA: Diagnosis not present

## 2023-01-18 DIAGNOSIS — H2511 Age-related nuclear cataract, right eye: Secondary | ICD-10-CM | POA: Diagnosis not present

## 2023-01-18 DIAGNOSIS — H25011 Cortical age-related cataract, right eye: Secondary | ICD-10-CM | POA: Diagnosis not present

## 2023-01-18 DIAGNOSIS — H25811 Combined forms of age-related cataract, right eye: Secondary | ICD-10-CM | POA: Diagnosis not present

## 2023-03-04 NOTE — Progress Notes (Unsigned)
HPI: Amber Baird is a 88 y.o. female with PMHx significant for aortic atherosclerosis,HTN,CKD III, chronic diarreha, HLD, and back pain here today with  the following concerns:   Insomnia: She reports that gabapentin, initially prescribed for back pain and sleep, is no longer as effective for sleep. She is currently sleeping around 5 hours per night, with difficulty falling asleep and occasional difficulty staying asleep. This issue has worsened over the past several months. She denies any recent events that could have aggravated her insomnia.  Bi palpebral ptosis:  She reports hx of cataracts and is planning on eyelid surgery in approximately 9 months. Her ophthalmologist wants to rule out myasthenia gravis before proceeding with the surgery. She denies diplopia but feels more fatigue at the end of the day and ptosis is more pronounce. Negative for difficulty breathing.  Dysphagia: She reports difficulty swallowing, particularly with meat and "heavier" vegetables, for several months. She has minimal heartburn and is currently taking Prilosec 20 mg daily. She describes a sensation of food getting stuck in her upper chest during swallowing, but denies any problem with swallowing fluids. In 12/30/16 she reported difficulty swallowing pills and EGD done same year. No hx of tobacco use.  She has no abdominal pain, nausea, or vomiting. Denies cough, SOB, CP,or abnormal wt loss.  For the past few years she has requested discontinuation of medications she has thought caused diarrhea, reporting improvement after doing so. Today she mentions that she is still having diarrhea, takes Imodium daily, which helps. She has not noted blood in stool or melena.  Review of Systems  Constitutional:  Positive for fatigue. Negative for activity change, appetite change, chills and fever.  HENT:  Negative for mouth sores, sore throat and voice change.   Endocrine: Negative for cold intolerance and heat  intolerance.  Genitourinary:  Negative for decreased urine volume, dysuria and hematuria.  Neurological:  Negative for syncope and headaches.  Psychiatric/Behavioral:  Negative for confusion and hallucinations.   See other pertinent positives and negatives in HPI.  Current Outpatient Medications on File Prior to Visit  Medication Sig Dispense Refill   alendronate (FOSAMAX) 70 MG tablet TAKE 1 TABLET(70 MG) BY MOUTH EVERY 7 DAYS WITH A FULL GLASS OF WATER AND ON AN EMPTY STOMACH 4 tablet 11   atorvastatin (LIPITOR) 40 MG tablet TAKE 1 TABLET DAILY 90 tablet 3   Biotin 5 MG CAPS Take 1 capsule by mouth daily.     Buprenorphine HCl (BELBUCA) 75 MCG FILM Place 75 mcg inside cheek daily.     diltiazem (CARDIZEM) 30 MG tablet TAKE 1 TABLET(30 MG) BY MOUTH THREE TIMES DAILY 270 tablet 2   hydrochlorothiazide (HYDRODIURIL) 25 MG tablet TAKE 1 TABLET DAILY 90 tablet 1   lisinopril (ZESTRIL) 30 MG tablet TAKE 1 TABLET(30 MG) BY MOUTH DAILY 90 tablet 2   Magnesium 400 MG CAPS Take 400 mg by mouth daily.     Multiple Vitamin (MULTIVITAMIN) tablet Take 1 tablet by mouth daily.     No current facility-administered medications on file prior to visit.   Past Medical History:  Diagnosis Date   Bullous pemphigoid    Hypertension    Allergies  Allergen Reactions   Carvedilol Diarrhea   Atenolol Other (See Comments)    Drops heart rate   Baclofen Other (See Comments)    Stroke like symptoms    Hydralazine Hcl Swelling and Other (See Comments)    Swelling on face, arms red from elbow to fingers.  Oxycodone Other (See Comments)    Confusion    Sulfa Antibiotics Other (See Comments)    Childhood allergy    Adhesive [Tape] Rash   Amlodipine Rash and Other (See Comments)    flushing    Social History   Socioeconomic History   Marital status: Widowed    Spouse name: Not on file   Number of children: 1   Years of education: Not on file   Highest education level: Not on file  Occupational  History   Not on file  Tobacco Use   Smoking status: Never   Smokeless tobacco: Never  Vaping Use   Vaping status: Never Used  Substance and Sexual Activity   Alcohol use: No    Alcohol/week: 0.0 standard drinks of alcohol   Drug use: No   Sexual activity: Not on file  Other Topics Concern   Not on file  Social History Narrative   Not on file   Social Determinants of Health   Financial Resource Strain: Low Risk  (05/17/2022)   Overall Financial Resource Strain (CARDIA)    Difficulty of Paying Living Expenses: Not hard at all  Food Insecurity: No Food Insecurity (05/17/2022)   Hunger Vital Sign    Worried About Running Out of Food in the Last Year: Never true    Ran Out of Food in the Last Year: Never true  Transportation Needs: No Transportation Needs (05/17/2022)   PRAPARE - Administrator, Civil Service (Medical): No    Lack of Transportation (Non-Medical): No  Physical Activity: Inactive (05/17/2022)   Exercise Vital Sign    Days of Exercise per Week: 0 days    Minutes of Exercise per Session: 0 min  Stress: No Stress Concern Present (05/17/2022)   [employer] of Occupational Health - Occupational Stress Questionnaire    Feeling of Stress : Not at all  Social Connections: Socially Isolated (05/15/2021)   Social Connection and Isolation Panel [NHANES]    Frequency of Communication with Friends and Family: Twice a week    Frequency of Social Gatherings with Friends and Family: Twice a week    Attends Religious Services: Never    Database administrator or Organizations: No    Attends Banker Meetings: Never    Marital Status: Widowed   Vitals:   03/07/23 1020  BP: 120/60  Pulse: 64  Resp: 16  Temp: 98.3 F (36.8 C)  SpO2: 99%   Wt Readings from Last 3 Encounters:  03/07/23 132 lb 2 oz (59.9 kg)  09/24/22 133 lb 2 oz (60.4 kg)  05/17/22 130 lb (59 kg)   Body mass index is 24.96 kg/m.  Physical Exam Vitals and nursing note  reviewed.  Constitutional:      General: She is not in acute distress.    Appearance: She is well-developed.  HENT:     Head: Normocephalic and atraumatic.     Mouth/Throat:     Mouth: Mucous membranes are moist.     Pharynx: Oropharynx is clear.  Eyes:     Conjunctiva/sclera: Conjunctivae normal.  Cardiovascular:     Rate and Rhythm: Normal rate and regular rhythm.     Heart sounds: Murmur (SEM I/VI RUSB) heard.  Pulmonary:     Effort: Pulmonary effort is normal. No respiratory distress.     Breath sounds: Normal breath sounds.  Abdominal:     Palpations: Abdomen is soft. There is no mass.     Tenderness: There is no  abdominal tenderness.  Musculoskeletal:     Thoracic back: No tenderness.     Lumbar back: No tenderness.  Lymphadenopathy:     Cervical: No cervical adenopathy.  Skin:    General: Skin is warm.     Findings: No erythema or rash.  Neurological:     General: No focal deficit present.     Mental Status: She is alert and oriented to person, place, and time.     Comments: Antalgic gait, not assisted.  Psychiatric:     Comments: Well groomed, good eye contact.   ASSESSMENT AND PLAN:  Amber Baird was seen today for medical management of chronic issues.  Diagnoses and all orders for this visit:  Ptosis of both eyelids According to patient, her ophthalmology recommended to be screened for myasthenia gravis.  She reports worsening fatigue at the end of the day as well as bilateral palpebral ptosis. We discussed a few signs or symptoms of myasthenia gravis. Further recommendation will be given according to lab result.  -     Acetylcholine receptor, blocking Abs  Difficulty swallowing solids We discussed possible etiologies, including serious process. We decided to hold on GI referral. Omeprazole dose increased from 20 mg daily to twice daily. Swallowing study will be arranged. Instructed about warning signs. Follow-up in 2 months.  -     Acetylcholine  receptor, blocking Abs -     DG ESOPHAGUS W SINGLE CM (SOL OR THIN BA); Future  Gastroesophageal reflux disease, unspecified whether esophagitis present Assessment & Plan: Still having occasional heartburn. Difficulty swallowing she is reporting today could be related to this problem, recommend increasing dose of omeprazole from once daily to twice daily, 30-minute before meals. Continue GERD precautions. Follow-up in 2 months.  Orders: -     Omeprazole; Take 1 capsule (20 mg total) by mouth 2 (two) times daily before a meal.  Insomnia, unspecified type Assessment & Plan: Gabapentin is no longer helping, so we will decrease dose gradually to 100 mg daily at bedtime. Stressed the importance of good sleep hygiene. We discussed a few pharmacologic options, given her history of chronic diarrhea, doxepin was recommended. She will start doxepin 3 mg daily 30 to 60 minutes before bedtime, she can increase dose to 60 mg if needed after the week. We discussed some side effects. Follow-up in 2 months.  Orders: -     Gabapentin; Take 1-2 capsules (100-200 mg total) by mouth at bedtime.  Dispense: 60 capsule; Refill: 1 -     Doxepin HCl; Take 0.3-0.6 mLs (3-6 mg total) by mouth at bedtime.  Dispense: 30 mL; Refill: 1  Chronic low back pain, unspecified back pain laterality, unspecified whether sciatica present Assessment & Plan: Problem has been stable. She does not think gabapentin has helped, so we will start decreasing dose and wean it off if tolerated. Decrease gabapentin from 300 mg to 200 mg daily for 15 days and then decrease to 100 mg daily at bedtime. Follow-up in 2 months, before if needed.  Orders: -     Gabapentin; Take 1-2 capsules (100-200 mg total) by mouth at bedtime.  Dispense: 60 capsule; Refill: 1  Chronic diarrhea Assessment & Plan: Problem has been stable for a couple of years, she has attributed problem to medications but apparently problem has not resolved after  discontinuation. She is currently taking Imodium as needed. Doxepin started today to help with sleep may actually help with this problem as well. Continue adequate hydration. I could not find report  of last colonoscopy.  Orders: -     Doxepin HCl; Take 0.3-0.6 mLs (3-6 mg total) by mouth at bedtime.  Dispense: 30 mL; Refill: 1  I spent a total of 43 minutes in both face to face and non face to face activities for this visit on the date of this encounter. During this time history was obtained and documented, examination was performed, prior labs reviewed, and assessment/plan discussed.  Return in about 2 months (around 05/07/2023) for chronic problems.   G. [place], MD  [facility]. [place] office.

## 2023-03-07 ENCOUNTER — Ambulatory Visit: Payer: Medicare Other | Admitting: Family Medicine

## 2023-03-07 ENCOUNTER — Encounter: Payer: Self-pay | Admitting: Family Medicine

## 2023-03-07 VITALS — BP 120/60 | HR 64 | Temp 98.3°F | Resp 16 | Ht 61.0 in | Wt 132.1 lb

## 2023-03-07 DIAGNOSIS — R1310 Dysphagia, unspecified: Secondary | ICD-10-CM | POA: Diagnosis not present

## 2023-03-07 DIAGNOSIS — M545 Low back pain, unspecified: Secondary | ICD-10-CM

## 2023-03-07 DIAGNOSIS — G4700 Insomnia, unspecified: Secondary | ICD-10-CM | POA: Diagnosis not present

## 2023-03-07 DIAGNOSIS — H02403 Unspecified ptosis of bilateral eyelids: Secondary | ICD-10-CM | POA: Diagnosis not present

## 2023-03-07 DIAGNOSIS — K529 Noninfective gastroenteritis and colitis, unspecified: Secondary | ICD-10-CM | POA: Diagnosis not present

## 2023-03-07 DIAGNOSIS — K219 Gastro-esophageal reflux disease without esophagitis: Secondary | ICD-10-CM | POA: Diagnosis not present

## 2023-03-07 DIAGNOSIS — G8929 Other chronic pain: Secondary | ICD-10-CM | POA: Diagnosis not present

## 2023-03-07 MED ORDER — OMEPRAZOLE 20 MG PO CPDR
20.0000 mg | DELAYED_RELEASE_CAPSULE | Freq: Two times a day (BID) | ORAL | Status: DC
Start: 2023-03-07 — End: 2023-04-27

## 2023-03-07 MED ORDER — DOXEPIN HCL 10 MG/ML PO CONC
3.0000 mg | Freq: Every day | ORAL | 1 refills | Status: DC
Start: 2023-03-07 — End: 2023-04-13

## 2023-03-07 MED ORDER — GABAPENTIN 100 MG PO CAPS
100.0000 mg | ORAL_CAPSULE | Freq: Every day | ORAL | 1 refills | Status: DC
Start: 2023-03-07 — End: 2023-12-27

## 2023-03-07 NOTE — Assessment & Plan Note (Signed)
Still having occasional heartburn. Difficulty swallowing she is reporting today could be related to this problem, recommend increasing dose of omeprazole from once daily to twice daily, 30-minute before meals. Continue GERD precautions. Follow-up in 2 months.

## 2023-03-07 NOTE — Patient Instructions (Addendum)
A few things to remember from today's visit:  Ptosis of both eyelids - Plan: Acetylcholine receptor, blocking Abs  Gastroesophageal reflux disease - Plan: omeprazole (PRILOSEC) 20 MG capsule  Difficulty swallowing solids - Plan: Acetylcholine receptor, blocking Abs, DG ESOPHAGUS W SINGLE CM (SOL OR THIN BA)  Decrease Gabapentin from 300 mg to 200 mg (2 caps of 100 mg) for 15 days then 100 mg (1 cap). Doxepin 0.3 ml 30-60 min before bedtime and increased to 0.6 ml in a week.  If you need refills for medications you take chronically, please call your pharmacy. Do not use My Chart to request refills or for acute issues that need immediate attention. If you send a my chart message, it may take a few days to be addressed, specially if I am not in the office.  Please be sure medication list is accurate. If a new problem present, please set up appointment sooner than planned today.

## 2023-03-07 NOTE — Assessment & Plan Note (Signed)
Gabapentin is no longer helping, so we will decrease dose gradually to 100 mg daily at bedtime. Stressed the importance of good sleep hygiene. We discussed a few pharmacologic options, given her history of chronic diarrhea, doxepin was recommended. She will start doxepin 3 mg daily 30 to 60 minutes before bedtime, she can increase dose to 60 mg if needed after the week. We discussed some side effects. Follow-up in 2 months.

## 2023-03-07 NOTE — Assessment & Plan Note (Signed)
Problem has been stable. She does not think gabapentin has helped, so we will start decreasing dose and wean it off if tolerated. Decrease gabapentin from 300 mg to 200 mg daily for 15 days and then decrease to 100 mg daily at bedtime. Follow-up in 2 months, before if needed.

## 2023-03-07 NOTE — Assessment & Plan Note (Addendum)
Problem has been stable for years, she is currently taking Imodium as needed. Doxepin started today to help with sleep may actually help with this problem as well. Continue adequate hydration.  I could not find last colonoscopy report.

## 2023-03-17 ENCOUNTER — Ambulatory Visit
Admission: RE | Admit: 2023-03-17 | Discharge: 2023-03-17 | Disposition: A | Discharge status: Home / Self Care | Payer: Medicare Other | Source: Ambulatory Visit | Attending: Family Medicine | Admitting: Family Medicine

## 2023-03-17 ENCOUNTER — Telehealth: Payer: Self-pay | Admitting: *Deleted

## 2023-03-17 DIAGNOSIS — R1310 Dysphagia, unspecified: Secondary | ICD-10-CM

## 2023-03-17 DIAGNOSIS — K219 Gastro-esophageal reflux disease without esophagitis: Secondary | ICD-10-CM | POA: Diagnosis not present

## 2023-03-17 DIAGNOSIS — R933 Abnormal findings on diagnostic imaging of other parts of digestive tract: Secondary | ICD-10-CM

## 2023-03-17 DIAGNOSIS — K449 Diaphragmatic hernia without obstruction or gangrene: Secondary | ICD-10-CM | POA: Diagnosis not present

## 2023-03-17 NOTE — Telephone Encounter (Signed)
Patient is aware of the plan for a referral to GI.

## 2023-03-17 NOTE — Telephone Encounter (Signed)
Pt called back stating she just spoke to someone and she forgot to mention something important.  Pt asking to have call returned.

## 2023-03-17 NOTE — Telephone Encounter (Signed)
CRITICAL VALUE STICKER  CRITICAL VALUE:Impression #2 Nonspecific contour irregularity of the distal esophagus.   RECEIVER (on-site recipient of call): Dickey Gave  DATE & TIME NOTIFIED: 03/17/23 2:52 pm  MESSENGER (representative from lab): [facility]   MD NOTIFIED: Jiles Garter, NP  TIME OF NOTIFICATION:03/17/23  RESPONSE:  waiting on response.

## 2023-03-18 NOTE — Addendum Note (Signed)
Addended by: Kathreen Devoid on: 03/18/2023 10:49 AM   Modules accepted: Orders

## 2023-03-18 NOTE — Telephone Encounter (Signed)
I called and spoke with patient. Amber Baird wants to go back to see Dr. Elnoria Howard since Amber Baird has seen him before. Referral updated.

## 2023-03-21 DIAGNOSIS — R933 Abnormal findings on diagnostic imaging of other parts of digestive tract: Secondary | ICD-10-CM | POA: Diagnosis not present

## 2023-03-21 DIAGNOSIS — K219 Gastro-esophageal reflux disease without esophagitis: Secondary | ICD-10-CM | POA: Diagnosis not present

## 2023-03-21 DIAGNOSIS — R1310 Dysphagia, unspecified: Secondary | ICD-10-CM | POA: Diagnosis not present

## 2023-03-23 DIAGNOSIS — N390 Urinary tract infection, site not specified: Secondary | ICD-10-CM | POA: Diagnosis not present

## 2023-03-23 DIAGNOSIS — E785 Hyperlipidemia, unspecified: Secondary | ICD-10-CM | POA: Diagnosis not present

## 2023-03-23 DIAGNOSIS — N1832 Chronic kidney disease, stage 3b: Secondary | ICD-10-CM | POA: Diagnosis not present

## 2023-03-23 DIAGNOSIS — I129 Hypertensive chronic kidney disease with stage 1 through stage 4 chronic kidney disease, or unspecified chronic kidney disease: Secondary | ICD-10-CM | POA: Diagnosis not present

## 2023-03-24 LAB — LAB REPORT - SCANNED: EGFR: 41

## 2023-04-05 DIAGNOSIS — Q398 Other congenital malformations of esophagus: Secondary | ICD-10-CM | POA: Diagnosis not present

## 2023-04-05 DIAGNOSIS — K219 Gastro-esophageal reflux disease without esophagitis: Secondary | ICD-10-CM | POA: Diagnosis not present

## 2023-04-05 DIAGNOSIS — K449 Diaphragmatic hernia without obstruction or gangrene: Secondary | ICD-10-CM | POA: Diagnosis not present

## 2023-04-05 DIAGNOSIS — R1310 Dysphagia, unspecified: Secondary | ICD-10-CM | POA: Diagnosis not present

## 2023-04-07 ENCOUNTER — Encounter: Payer: Self-pay | Admitting: Gastroenterology

## 2023-04-12 NOTE — Progress Notes (Unsigned)
ACUTE VISIT No chief complaint on file.  HPI: Amber Baird is a 88 y.o. female, who is here today complaining of *** HPI  Review of Systems See other pertinent positives and negatives in HPI.  Current Outpatient Medications on File Prior to Visit  Medication Sig Dispense Refill   alendronate (FOSAMAX) 70 MG tablet TAKE 1 TABLET(70 MG) BY MOUTH EVERY 7 DAYS WITH A FULL GLASS OF WATER AND ON AN EMPTY STOMACH 4 tablet 11   atorvastatin (LIPITOR) 40 MG tablet TAKE 1 TABLET DAILY 90 tablet 3   Biotin 5 MG CAPS Take 1 capsule by mouth daily.     Buprenorphine HCl (BELBUCA) 75 MCG FILM Place 75 mcg inside cheek daily.     diltiazem (CARDIZEM) 30 MG tablet TAKE 1 TABLET(30 MG) BY MOUTH THREE TIMES DAILY 270 tablet 2   doxepin (SINEQUAN) 10 MG/ML solution Take 0.3-0.6 mLs (3-6 mg total) by mouth at bedtime. 30 mL 1   gabapentin (NEURONTIN) 100 MG capsule Take 1-2 capsules (100-200 mg total) by mouth at bedtime. 60 capsule 1   hydrochlorothiazide (HYDRODIURIL) 25 MG tablet TAKE 1 TABLET DAILY 90 tablet 1   lisinopril (ZESTRIL) 30 MG tablet TAKE 1 TABLET(30 MG) BY MOUTH DAILY 90 tablet 2   Magnesium 400 MG CAPS Take 400 mg by mouth daily.     Multiple Vitamin (MULTIVITAMIN) tablet Take 1 tablet by mouth daily.     omeprazole (PRILOSEC) 20 MG capsule Take 1 capsule (20 mg total) by mouth 2 (two) times daily before a meal.     No current facility-administered medications on file prior to visit.    Past Medical History:  Diagnosis Date   Bullous pemphigoid    Hypertension    Allergies  Allergen Reactions   Carvedilol Diarrhea   Atenolol Other (See Comments)    Drops heart rate   Baclofen Other (See Comments)    Stroke like symptoms    Hydralazine Hcl Swelling and Other (See Comments)    Swelling on face, arms red from elbow to fingers.   Oxycodone Other (See Comments)    Confusion    Sulfa Antibiotics Other (See Comments)    Childhood allergy    Adhesive [Tape] Rash    Amlodipine Rash and Other (See Comments)    flushing    Social History   Socioeconomic History   Marital status: Widowed    Spouse name: Not on file   Number of children: 1   Years of education: Not on file   Highest education level: Not on file  Occupational History   Not on file  Tobacco Use   Smoking status: Never   Smokeless tobacco: Never  Vaping Use   Vaping status: Never Used  Substance and Sexual Activity   Alcohol use: No    Alcohol/week: 0.0 standard drinks of alcohol   Drug use: No   Sexual activity: Not on file  Other Topics Concern   Not on file  Social History Narrative   Not on file   Social Determinants of Health   Financial Resource Strain: Low Risk  (05/17/2022)   Overall Financial Resource Strain (CARDIA)    Difficulty of Paying Living Expenses: Not hard at all  Food Insecurity: No Food Insecurity (05/17/2022)   Hunger Vital Sign    Worried About Running Out of Food in the Last Year: Never true    Ran Out of Food in the Last Year: Never true  Transportation Needs: No Transportation Needs (05/17/2022)  PRAPARE - Administrator, Civil Service (Medical): No    Lack of Transportation (Non-Medical): No  Physical Activity: Inactive (05/17/2022)   Exercise Vital Sign    Days of Exercise per Week: 0 days    Minutes of Exercise per Session: 0 min  Stress: No Stress Concern Present (05/17/2022)   [employer] of Occupational Health - Occupational Stress Questionnaire    Feeling of Stress : Not at all  Social Connections: Socially Isolated (05/15/2021)   Social Connection and Isolation Panel [NHANES]    Frequency of Communication with Friends and Family: Twice a week    Frequency of Social Gatherings with Friends and Family: Twice a week    Attends Religious Services: Never    Database administrator or Organizations: No    Attends Banker Meetings: Never    Marital Status: Widowed    There were no vitals filed for this  visit. There is no height or weight on file to calculate BMI.  Physical Exam  ASSESSMENT AND PLAN: There are no diagnoses linked to this encounter.  No follow-ups on file.  Zillah Alexie G. [place], MD  [facility]. [place] office.  Discharge Instructions   None

## 2023-04-13 ENCOUNTER — Encounter: Payer: Self-pay | Admitting: Family Medicine

## 2023-04-13 ENCOUNTER — Ambulatory Visit (INDEPENDENT_AMBULATORY_CARE_PROVIDER_SITE_OTHER): Payer: Medicare Other | Admitting: Family Medicine

## 2023-04-13 VITALS — BP 110/60 | HR 60 | Resp 16 | Ht 61.0 in | Wt 129.0 lb

## 2023-04-13 DIAGNOSIS — I10 Essential (primary) hypertension: Secondary | ICD-10-CM

## 2023-04-13 DIAGNOSIS — G4700 Insomnia, unspecified: Secondary | ICD-10-CM

## 2023-04-13 DIAGNOSIS — Z23 Encounter for immunization: Secondary | ICD-10-CM

## 2023-04-13 MED ORDER — TRAZODONE HCL 50 MG PO TABS
50.0000 mg | ORAL_TABLET | Freq: Every day | ORAL | 1 refills | Status: DC
Start: 2023-04-13 — End: 2023-06-10

## 2023-04-13 NOTE — Progress Notes (Signed)
ACUTE VISIT Chief Complaint  Patient presents with   Insomnia   HPI: Amber Baird is a 88 y.o. female with PMHx significant for aortic atherosclerosis, HTN, CKD III, chronic diarreha, HLD, and back pain , who is here today complaining of persistent insomnia.   Insomnia: She has stopped doxepin due to it causing a crawling sensation under her skin. She has been taking 3 mg, states it did not improve her sleep.  Usually she goes to sleep around 10 pm.  She has been on Gabapentin for chronic pain and used to help with sleep, she has been on 300 mg and wonders if she can try higher dose. She rates her pain at 4/10, it does not interfere with his sleep. No anxiety or depression.   -Since her last visit she has follow-up with her gastroenterologist, Dr. Adan Sis esophageal dilation, 04/05/23. She also reports that she had a biopsy, results are still pending.  Mentions that after procedure she was feeling fine once she got home, she took a 21 hours nap, woke up only to feed her cat, and fell back asleep for another 8 hours.  She reports that the following day she felt sluggish, which did not fully improve until Saturday.  She believes this was caused by the medication given at the time of procedure, reports that she was under anesthesia.  Chronic diarrhea, problem has been stable.  She states that she discussed this problem with her gastroenterologist, no further workup was deemed necessary.  -She has also seen her nephrologist, reports that her renal function is stable and recommended to continue annual follow-up. HTN on diltiazem 30 mg 3 times daily, HCTZ 25 mg daily, and lisinopril 30 mg daily. She has not been checking BP at home. Negative for unusual headache, visual changes, CP, dyspnea, palpitations,or edema.  Review of Systems  Constitutional:  Negative for chills and fever.  HENT:  Negative for sore throat and trouble swallowing.   Respiratory:  Negative for cough  and wheezing.   Gastrointestinal:  Negative for abdominal pain, nausea and vomiting.  Genitourinary:  Negative for decreased urine volume, dysuria and hematuria.  Skin:  Negative for rash.  Neurological:  Negative for syncope and facial asymmetry.  Psychiatric/Behavioral:  Positive for sleep disturbance. Negative for confusion and hallucinations.   See other pertinent positives and negatives in HPI.  Current Outpatient Medications on File Prior to Visit  Medication Sig Dispense Refill   alendronate (FOSAMAX) 70 MG tablet TAKE 1 TABLET(70 MG) BY MOUTH EVERY 7 DAYS WITH A FULL GLASS OF WATER AND ON AN EMPTY STOMACH 4 tablet 11   atorvastatin (LIPITOR) 40 MG tablet TAKE 1 TABLET DAILY 90 tablet 3   Biotin 5 MG CAPS Take 1 capsule by mouth daily.     Buprenorphine HCl (BELBUCA) 75 MCG FILM Place 75 mcg inside cheek daily.     diltiazem (CARDIZEM) 30 MG tablet TAKE 1 TABLET(30 MG) BY MOUTH THREE TIMES DAILY 270 tablet 2   gabapentin (NEURONTIN) 100 MG capsule Take 1-2 capsules (100-200 mg total) by mouth at bedtime. 60 capsule 1   hydrochlorothiazide (HYDRODIURIL) 25 MG tablet TAKE 1 TABLET DAILY 90 tablet 1   lisinopril (ZESTRIL) 30 MG tablet TAKE 1 TABLET(30 MG) BY MOUTH DAILY 90 tablet 2   Magnesium 400 MG CAPS Take 400 mg by mouth daily.     Multiple Vitamin (MULTIVITAMIN) tablet Take 1 tablet by mouth daily.     omeprazole (PRILOSEC) 20 MG capsule Take 1 capsule (20  mg total) by mouth 2 (two) times daily before a meal.     No current facility-administered medications on file prior to visit.    Past Medical History:  Diagnosis Date   Bullous pemphigoid    Hypertension    Allergies  Allergen Reactions   Carvedilol Diarrhea   Atenolol Other (See Comments)    Drops heart rate   Baclofen Other (See Comments)    Stroke like symptoms    Hydralazine Hcl Swelling and Other (See Comments)    Swelling on face, arms red from elbow to fingers.   Oxycodone Other (See Comments)    Confusion     Sulfa Antibiotics Other (See Comments)    Childhood allergy    Adhesive [Tape] Rash   Amlodipine Rash and Other (See Comments)    flushing    Social History   Socioeconomic History   Marital status: Widowed    Spouse name: Not on file   Number of children: 1   Years of education: Not on file   Highest education level: Not on file  Occupational History   Not on file  Tobacco Use   Smoking status: Never   Smokeless tobacco: Never  Vaping Use   Vaping status: Never Used  Substance and Sexual Activity   Alcohol use: No    Alcohol/week: 0.0 standard drinks of alcohol   Drug use: No   Sexual activity: Not on file  Other Topics Concern   Not on file  Social History Narrative   Not on file   Social Determinants of Health   Financial Resource Strain: Low Risk  (05/17/2022)   Overall Financial Resource Strain (CARDIA)    Difficulty of Paying Living Expenses: Not hard at all  Food Insecurity: No Food Insecurity (05/17/2022)   Hunger Vital Sign    Worried About Running Out of Food in the Last Year: Never true    Ran Out of Food in the Last Year: Never true  Transportation Needs: No Transportation Needs (05/17/2022)   PRAPARE - Administrator, Civil Service (Medical): No    Lack of Transportation (Non-Medical): No  Physical Activity: Inactive (05/17/2022)   Exercise Vital Sign    Days of Exercise per Week: 0 days    Minutes of Exercise per Session: 0 min  Stress: No Stress Concern Present (05/17/2022)   [employer] of Occupational Health - Occupational Stress Questionnaire    Feeling of Stress : Not at all  Social Connections: Socially Isolated (05/15/2021)   Social Connection and Isolation Panel [NHANES]    Frequency of Communication with Friends and Family: Twice a week    Frequency of Social Gatherings with Friends and Family: Twice a week    Attends Religious Services: Never    Database administrator or Organizations: No    Attends Tax inspector Meetings: Never    Marital Status: Widowed   Vitals:   04/13/23 1137  BP: 110/60  Pulse: 60  Resp: 16  SpO2: 97%   Body mass index is 24.37 kg/m.  Physical Exam Vitals and nursing note reviewed.  Constitutional:      General: She is not in acute distress.    Appearance: She is well-developed. She is not ill-appearing.  HENT:     Head: Normocephalic and atraumatic.  Eyes:     Conjunctiva/sclera: Conjunctivae normal.  Cardiovascular:     Rate and Rhythm: Normal rate and regular rhythm.     Heart sounds: Murmur ((SEM I/VI RUSB)  heard) heard.  Pulmonary:     Effort: Pulmonary effort is normal. No respiratory distress.     Breath sounds: Normal breath sounds.  Abdominal:     Palpations: Abdomen is soft. There is no mass.     Tenderness: There is no abdominal tenderness.  Skin:    General: Skin is warm.     Findings: No erythema or rash.  Neurological:     General: No focal deficit present.     Mental Status: She is alert and oriented to person, place, and time.     Deep Tendon Reflexes:     Reflex Scores:      Patellar reflexes are 2+ on the right side and 2+ on the left side.    Comments: Antalgic gait, not assisted.   Psychiatric:        Mood and Affect: Mood and affect normal.   ASSESSMENT AND PLAN:  Ms. Kelsh was seen today for insomnia  Insomnia, unspecified type Assessment & Plan: Tried Doxepin 3 mg, did not help and caused some side effects, crawling skin sensation. We discussed other pharmacologic options, she agrees with trying trazodone, starting with 25 mg at bedtime, she can increase dose to 50 mg in 5 to 7 days if well-tolerated. Continue good sleep hygiene. She was instructed to let me know in a couple weeks if medication is helping.  Orders: -     traZODone HCl; Take 1 tablet (50 mg total) by mouth at bedtime.  Dispense: 30 tablet; Refill: 1  Essential hypertension Assessment & Plan: BP today lower normal range. Continue Cardizem 30  mg 3 times daily, HCTZ 25 mg daily, and lisinopril 30 mg daily. Instructed to monitor BP at home regularly. Eye exam is current.   Need for influenza vaccination -     Flu Vaccine Trivalent High Dose (Fluad)   Return in about 4 months (around 08/13/2023) for chronic problems.  I,Rachel Rivera,acting as a scribe for Halton Neas [place], MD.,have documented all relevant documentation on the behalf of Ilamae Geng [place], MD,as directed by  Elorah Dewing [place], MD while in the presence of Laney Bagshaw [place], MD.  I, Nonie Lochner [place], MD, have reviewed all documentation for this visit. The documentation on 04/13/23 for the exam, diagnosis, procedures, and orders are all accurate and complete.  Eragon Hammond G. [place], MD  [facility]. [place] office.

## 2023-04-13 NOTE — Assessment & Plan Note (Signed)
BP today lower normal range. Continue Cardizem 30 mg 3 times daily, HCTZ 25 mg daily, and lisinopril 30 mg daily. Instructed to monitor BP at home regularly. Eye exam is current.

## 2023-04-13 NOTE — Patient Instructions (Addendum)
A few things to remember from today's visit:  Insomnia, unspecified type - Plan: traZODone (DESYREL) 50 MG tablet  Essential hypertension  Trazodone 1/2 tab at bedtime , can increase to 50 mg in 5-7 days. Let me know if it does not help, we can continue adjusting dose. Monitor blood pressure at home.   If you need refills for medications you take chronically, please call your pharmacy. Do not use My Chart to request refills or for acute issues that need immediate attention. If you send a my chart message, it may take a few days to be addressed, specially if I am not in the office.  Please be sure medication list is accurate. If a new problem present, please set up appointment sooner than planned today.

## 2023-04-13 NOTE — Assessment & Plan Note (Signed)
Tried Doxepin 3 mg, did not help and caused some side effects, crawling skin sensation. We discussed other pharmacologic options, she agrees with trying trazodone, starting with 25 mg at bedtime, she can increase dose to 50 mg in 5 to 7 days if well-tolerated. Continue good sleep hygiene. She was instructed to let me know in a couple weeks if medication is helping.

## 2023-04-18 DIAGNOSIS — H40013 Open angle with borderline findings, low risk, bilateral: Secondary | ICD-10-CM | POA: Diagnosis not present

## 2023-04-18 DIAGNOSIS — H02402 Unspecified ptosis of left eyelid: Secondary | ICD-10-CM | POA: Diagnosis not present

## 2023-04-18 DIAGNOSIS — Z961 Presence of intraocular lens: Secondary | ICD-10-CM | POA: Diagnosis not present

## 2023-04-27 ENCOUNTER — Other Ambulatory Visit: Payer: Self-pay | Admitting: Family Medicine

## 2023-04-27 ENCOUNTER — Ambulatory Visit: Payer: Medicare Other | Admitting: Family Medicine

## 2023-04-27 ENCOUNTER — Encounter: Payer: Self-pay | Admitting: Family Medicine

## 2023-04-27 VITALS — BP 120/80 | HR 100 | Temp 97.9°F | Resp 16 | Ht 61.0 in | Wt 126.4 lb

## 2023-04-27 DIAGNOSIS — R110 Nausea: Secondary | ICD-10-CM | POA: Diagnosis not present

## 2023-04-27 DIAGNOSIS — I499 Cardiac arrhythmia, unspecified: Secondary | ICD-10-CM | POA: Diagnosis not present

## 2023-04-27 DIAGNOSIS — R1013 Epigastric pain: Secondary | ICD-10-CM | POA: Diagnosis not present

## 2023-04-27 MED ORDER — PANTOPRAZOLE SODIUM 40 MG PO TBEC
40.0000 mg | DELAYED_RELEASE_TABLET | Freq: Every day | ORAL | 1 refills | Status: DC
Start: 2023-04-27 — End: 2023-04-27

## 2023-04-27 MED ORDER — ONDANSETRON HCL 4 MG PO TABS: 4.0000 mg | ORAL_TABLET | Freq: Two times a day (BID) | ORAL | 0 refills | Status: DC | PRN

## 2023-04-27 NOTE — Progress Notes (Signed)
ACUTE VISIT Chief Complaint  Patient presents with   lack of appetite    Still having stomach problems, have seemed to worsen since last visit.    HPI: Amber Baird is a 88 y.o. female with a PMHx significant for aortic atherosclerosis, HTN, CKD III, chronic diarrhea, HLD, back pain, and insomnia who is here today complaining of lack of appetite as described above.  Last seen 04/13/2023.  She complains of decreased appetite. She states she can't stand the smell of food. She says her taste has changed and everything, including water, tastes bitter. She endorses nausea, increase "gas", and belching.   She notes she has not had diarrhea for the past 10 days, chronic diarrhea, 10 years.  She is having bowel movements every other day with no blood in the stool or melena. She denies fever,chills, problems swallowing, vomiting, heartburn. Negative for unusual headache, sore throat, CP, palpitations, SOB, urine changes, or changes in smelling She has taken 2 Covid tests and both were negative.   GERD: Follows with GI. Dysphagia, s/p EGD and esophageal dilation, symptoms resolved. She is on Omeprazole 20 mg bid.  Lab Results  Component Value Date   NA 140 09/24/2022   CL 99 09/24/2022   K 3.7 09/24/2022   CO2 22 09/24/2022   BUN 31 (H) 09/24/2022   CREATININE 1.24 (H) 09/24/2022   EGFR 41.0 03/24/2023   CALCIUM 9.5 09/24/2022   PHOS 3.7 11/18/2020   ALBUMIN 4.1 09/24/2022   GLUCOSE 117 (H) 09/24/2022   Lab Results  Component Value Date   ALT 15 09/24/2022   AST 17 09/24/2022   ALKPHOS 61 09/24/2022   BILITOT 0.4 09/24/2022   Lab Results  Component Value Date   TSH 1.95 05/19/2020   On 04/13/23 she was c/o insomnia, she reports her sleep has improved with trazodone 50 mg at bedtime.   Review of Systems  Constitutional:  Negative for activity change and chills.  HENT:  Negative for congestion and rhinorrhea.   Respiratory:  Negative for cough and wheezing.    Cardiovascular:  Negative for leg swelling.  Gastrointestinal:  Negative for abdominal distention and abdominal pain.  Endocrine: Negative for cold intolerance and heat intolerance.  Genitourinary:  Negative for decreased urine volume, dysuria and hematuria.  Skin:  Negative for rash.  Neurological:  Negative for syncope, facial asymmetry and weakness.  Psychiatric/Behavioral:  Negative for confusion and hallucinations.   See other pertinent positives and negatives in HPI.  Current Outpatient Medications on File Prior to Visit  Medication Sig Dispense Refill   alendronate (FOSAMAX) 70 MG tablet TAKE 1 TABLET(70 MG) BY MOUTH EVERY 7 DAYS WITH A FULL GLASS OF WATER AND ON AN EMPTY STOMACH 4 tablet 11   atorvastatin (LIPITOR) 40 MG tablet TAKE 1 TABLET DAILY 90 tablet 3   Biotin 5 MG CAPS Take 1 capsule by mouth daily.     Buprenorphine HCl (BELBUCA) 75 MCG FILM Place 75 mcg inside cheek daily.     diltiazem (CARDIZEM) 30 MG tablet TAKE 1 TABLET(30 MG) BY MOUTH THREE TIMES DAILY 270 tablet 2   gabapentin (NEURONTIN) 100 MG capsule Take 1-2 capsules (100-200 mg total) by mouth at bedtime. 60 capsule 1   hydrochlorothiazide (HYDRODIURIL) 25 MG tablet TAKE 1 TABLET DAILY 90 tablet 1   lisinopril (ZESTRIL) 30 MG tablet TAKE 1 TABLET(30 MG) BY MOUTH DAILY 90 tablet 2   Magnesium 400 MG CAPS Take 400 mg by mouth daily.     Multiple  Vitamin (MULTIVITAMIN) tablet Take 1 tablet by mouth daily.     traZODone (DESYREL) 50 MG tablet Take 1 tablet (50 mg total) by mouth at bedtime. 30 tablet 1   No current facility-administered medications on file prior to visit.    Past Medical History:  Diagnosis Date   Bullous pemphigoid    Hypertension    Allergies  Allergen Reactions   Carvedilol Diarrhea   Atenolol Other (See Comments)    Drops heart rate   Baclofen Other (See Comments)    Stroke like symptoms    Hydralazine Hcl Swelling and Other (See Comments)    Swelling on face, arms red from elbow  to fingers.   Oxycodone Other (See Comments)    Confusion    Sulfa Antibiotics Other (See Comments)    Childhood allergy    Adhesive [Tape] Rash   Amlodipine Rash and Other (See Comments)    flushing    Social History   Socioeconomic History   Marital status: Widowed    Spouse name: Not on file   Number of children: 1   Years of education: Not on file   Highest education level: Not on file  Occupational History   Not on file  Tobacco Use   Smoking status: Never   Smokeless tobacco: Never  Vaping Use   Vaping status: Never Used  Substance and Sexual Activity   Alcohol use: No    Alcohol/week: 0.0 standard drinks of alcohol   Drug use: No   Sexual activity: Not on file  Other Topics Concern   Not on file  Social History Narrative   Not on file   Social Determinants of Health   Financial Resource Strain: Low Risk  (05/17/2022)   Overall Financial Resource Strain (CARDIA)    Difficulty of Paying Living Expenses: Not hard at all  Food Insecurity: No Food Insecurity (05/17/2022)   Hunger Vital Sign    Worried About Running Out of Food in the Last Year: Never true    Ran Out of Food in the Last Year: Never true  Transportation Needs: No Transportation Needs (05/17/2022)   PRAPARE - Administrator, Civil Service (Medical): No    Lack of Transportation (Non-Medical): No  Physical Activity: Inactive (05/17/2022)   Exercise Vital Sign    Days of Exercise per Week: 0 days    Minutes of Exercise per Session: 0 min  Stress: No Stress Concern Present (05/17/2022)   [employer] of Occupational Health - Occupational Stress Questionnaire    Feeling of Stress : Not at all  Social Connections: Socially Isolated (05/15/2021)   Social Connection and Isolation Panel [NHANES]    Frequency of Communication with Friends and Family: Twice a week    Frequency of Social Gatherings with Friends and Family: Twice a week    Attends Religious Services: Never    Automotive engineer or Organizations: No    Attends Banker Meetings: Never    Marital Status: Widowed   Vitals:   04/27/23 1145  BP: 120/80  Pulse: 100  Resp: 16  Temp: 97.9 F (36.6 C)  SpO2: 98%   Wt Readings from Last 3 Encounters:  04/27/23 126 lb 6 oz (57.3 kg)  04/13/23 129 lb (58.5 kg)  03/07/23 132 lb 2 oz (59.9 kg)   Body mass index is 23.88 kg/m.  Physical Exam Vitals and nursing note reviewed.  Constitutional:      General: She is not in acute distress.  Appearance: She is well-developed. She is not ill-appearing.  HENT:     Head: Normocephalic and atraumatic.     Mouth/Throat:     Mouth: Mucous membranes are moist.     Pharynx: Oropharynx is clear.  Eyes:     Conjunctiva/sclera: Conjunctivae normal.  Cardiovascular:     Rate and Rhythm: Normal rate. Rhythm irregular. Occasional Extrasystoles are present.    Heart sounds: Murmur ((SEM I/VI RUSB) heard) heard.  Pulmonary:     Effort: Pulmonary effort is normal. No respiratory distress.     Breath sounds: Normal breath sounds.  Abdominal:     Palpations: Abdomen is soft. There is no hepatomegaly or mass.     Tenderness: There is no abdominal tenderness.  Musculoskeletal:     Right lower leg: No edema.     Left lower leg: No edema.  Lymphadenopathy:     Cervical: No cervical adenopathy.  Skin:    General: Skin is warm.     Findings: No erythema or rash.  Neurological:     General: No focal deficit present.     Mental Status: She is alert and oriented to person, place, and time.     Deep Tendon Reflexes:     Reflex Scores:      Patellar reflexes are 2+ on the right side and 2+ on the left side.    Comments: Antalgic gait, not assisted.   Psychiatric:        Mood and Affect: Mood and affect normal.    ASSESSMENT AND PLAN:  Ms. Wehmeyer was seen today for lack of appetite and illness.   Dyspepsia We discussed differential Dx's. Agrees with changing Omeprazole for Pantoprazole 40  mg. GERD precautions to continue. Monitor for new symptoms. Instructed about warning signs.  Nausea without vomiting Could be related to above problem. Gradual wt loss since 03/2023, will continue monitoring. Zofran recommended for symptomatic treatment. Instructed about warning signs.  -     CBC; Future -     Comprehensive metabolic panel; Future -     Ondansetron HCl; Take 1 tablet (4 mg total) by mouth every 12 (twelve) hours as needed for nausea or vomiting.  Dispense: 10 tablet; Refill: 0  Irregular heart rate A few extra beats during auscultation, asymptomatic. EKG today: NSR,normal axis and intervals, incomplete RBBB. No significant changes when compared with EKG on 10/24/19. Instructed about warning signs.  -     EKG 12-Lead -     CBC; Future -     TSH; Future -     Comprehensive metabolic panel; Future  Return in about 2 months (around 06/27/2023).  I, Rolla Etienne Wierda, acting as a scribe for Amber Linn [place], MD., have documented all relevant documentation on the behalf of Amber Shamoon [place], MD, as directed by  Ahaan Zobrist [place], MD while in the presence of Keyara Ent [place], MD.   I, Earlie Schank [place], MD, have reviewed all documentation for this visit. The documentation on 04/28/23 for the exam, diagnosis, procedures, and orders are all accurate and complete.  Amber Collignon G. [place], MD  [facility]. [place] office.

## 2023-04-27 NOTE — Patient Instructions (Addendum)
A few things to remember from today's visit:  Nausea without vomiting - Plan: CBC, Comprehensive metabolic panel, ondansetron (ZOFRAN) 4 MG tablet  Irregular heart rate - Plan: EKG 12-Lead, CBC, TSH, Comprehensive metabolic panel  Dyspepsia - Plan: pantoprazole (PROTONIX) 40 MG tablet  Stop omeprazole. Start pantoprazole 40 mg 30-minute before breakfast. Zofran for nausea. Continue adequate hydration. Labs tomorrow. Follow-up in 2 months, before if needed.  If you need refills for medications you take chronically, please call your pharmacy. Do not use My Chart to request refills or for acute issues that need immediate attention. If you send a my chart message, it may take a few days to be addressed, specially if I am not in the office.  Please be sure medication list is accurate. If a new problem present, please set up appointment sooner than planned today.

## 2023-04-28 ENCOUNTER — Other Ambulatory Visit (INDEPENDENT_AMBULATORY_CARE_PROVIDER_SITE_OTHER): Payer: Medicare Other

## 2023-04-28 DIAGNOSIS — R110 Nausea: Secondary | ICD-10-CM

## 2023-04-28 DIAGNOSIS — I499 Cardiac arrhythmia, unspecified: Secondary | ICD-10-CM

## 2023-04-28 NOTE — Progress Notes (Signed)
Pt came for labs only, tolerated draw well.   

## 2023-04-29 LAB — CBC
HCT: 41.3 % (ref 36.0–46.0)
Hemoglobin: 13.5 g/dL (ref 12.0–15.0)
MCHC: 32.7 g/dL (ref 30.0–36.0)
MCV: 93.6 fL (ref 78.0–100.0)
Platelets: 303 10*3/uL (ref 150.0–400.0)
RBC: 4.42 Mil/uL (ref 3.87–5.11)
RDW: 13.1 % (ref 11.5–15.5)
WBC: 6.2 10*3/uL (ref 4.0–10.5)

## 2023-04-29 LAB — COMPREHENSIVE METABOLIC PANEL
ALT: 13 U/L (ref 0–35)
AST: 15 U/L (ref 0–37)
Albumin: 3.8 g/dL (ref 3.5–5.2)
Alkaline Phosphatase: 62 U/L (ref 39–117)
BUN: 24 mg/dL — ABNORMAL HIGH (ref 6–23)
CO2: 27 meq/L (ref 19–32)
Calcium: 9.2 mg/dL (ref 8.4–10.5)
Chloride: 101 meq/L (ref 96–112)
Creatinine, Ser: 1.2 mg/dL (ref 0.40–1.20)
GFR: 40.95 mL/min — ABNORMAL LOW (ref >60.00)
Glucose, Bld: 106 mg/dL — ABNORMAL HIGH (ref 70–99)
Potassium: 3.5 meq/L (ref 3.5–5.1)
Sodium: 139 meq/L (ref 135–145)
Total Bilirubin: 0.6 mg/dL (ref 0.2–1.2)
Total Protein: 5.9 g/dL — ABNORMAL LOW (ref 6.0–8.3)

## 2023-04-29 LAB — TSH: TSH: 1.74 u[IU]/mL (ref 0.35–5.50)

## 2023-05-02 DIAGNOSIS — L570 Actinic keratosis: Secondary | ICD-10-CM | POA: Diagnosis not present

## 2023-05-02 DIAGNOSIS — Z85828 Personal history of other malignant neoplasm of skin: Secondary | ICD-10-CM | POA: Diagnosis not present

## 2023-05-12 DIAGNOSIS — Z79899 Other long term (current) drug therapy: Secondary | ICD-10-CM | POA: Diagnosis not present

## 2023-05-12 DIAGNOSIS — Z6823 Body mass index (BMI) 23.0-23.9, adult: Secondary | ICD-10-CM | POA: Diagnosis not present

## 2023-05-12 DIAGNOSIS — G894 Chronic pain syndrome: Secondary | ICD-10-CM | POA: Diagnosis not present

## 2023-05-12 DIAGNOSIS — M47816 Spondylosis without myelopathy or radiculopathy, lumbar region: Secondary | ICD-10-CM | POA: Diagnosis not present

## 2023-05-12 DIAGNOSIS — Z79891 Long term (current) use of opiate analgesic: Secondary | ICD-10-CM | POA: Diagnosis not present

## 2023-06-02 ENCOUNTER — Other Ambulatory Visit: Payer: Self-pay | Admitting: Family Medicine

## 2023-06-02 DIAGNOSIS — I10 Essential (primary) hypertension: Secondary | ICD-10-CM

## 2023-06-09 ENCOUNTER — Other Ambulatory Visit: Payer: Self-pay | Admitting: Family Medicine

## 2023-06-09 DIAGNOSIS — G4700 Insomnia, unspecified: Secondary | ICD-10-CM

## 2023-06-27 ENCOUNTER — Ambulatory Visit (INDEPENDENT_AMBULATORY_CARE_PROVIDER_SITE_OTHER): Payer: Medicare Other | Admitting: Family Medicine

## 2023-06-27 ENCOUNTER — Encounter: Payer: Self-pay | Admitting: Family Medicine

## 2023-06-27 VITALS — BP 118/70 | HR 79 | Resp 16 | Ht 61.0 in | Wt 126.0 lb

## 2023-06-27 DIAGNOSIS — G4700 Insomnia, unspecified: Secondary | ICD-10-CM | POA: Diagnosis not present

## 2023-06-27 DIAGNOSIS — G8929 Other chronic pain: Secondary | ICD-10-CM

## 2023-06-27 DIAGNOSIS — K219 Gastro-esophageal reflux disease without esophagitis: Secondary | ICD-10-CM | POA: Diagnosis not present

## 2023-06-27 DIAGNOSIS — M545 Low back pain, unspecified: Secondary | ICD-10-CM | POA: Diagnosis not present

## 2023-06-27 NOTE — Patient Instructions (Addendum)
A few things to remember from today's visit:  Insomnia, unspecified type  Chronic low back pain, unspecified back pain laterality, unspecified whether sciatica present  Gastroesophageal reflux disease, unspecified whether esophagitis present  No changes today. If nausea gets worse, please arrange an appt with your gastroenterologist.  If you need refills for medications you take chronically, please call your pharmacy. Do not use My Chart to request refills or for acute issues that need immediate attention. If you send a my chart message, it may take a few days to be addressed, specially if I am not in the office.  Please be sure medication list is accurate. If a new problem present, please set up appointment sooner than planned today.

## 2023-06-27 NOTE — Assessment & Plan Note (Signed)
Still has some difficulty falling asleep but in general Trazodone 50 mg at bedtime is helping. Reports no side effects, sleeps about 6 hours. No changes today. Continue good sleep hygiene.

## 2023-06-27 NOTE — Progress Notes (Signed)
HPI: [name] is a 88 y.o. female with a PMHx significant for aortic atherosclerosis, HTN, GERD, CKD III, chronic diarrhea, HLD, back pain, and insomnia, who is here today for follow up.  Last seen on 04/27/2023.  She has seen orthopedics and dermatology since her last visit.   Digestive issues:  On her last visit, she was switched from omeprazole to pantoprazole 40 mg daily.  She says she still has some intermittent nausea, but it has greatly improved. She also says her appetite is still decreased, but seems to be stable. She has lost 3 pounds since her last visit.  She denies any difficulty swallowing.   Lab Results  Component Value Date   WBC 6.2 04/28/2023   HGB 13.5 04/28/2023   HCT 41.3 04/28/2023   MCV 93.6 04/28/2023   PLT 303.0 04/28/2023   Lab Results  Component Value Date   ALT 13 04/28/2023   AST 15 04/28/2023   ALKPHOS 62 04/28/2023   BILITOT 0.6 04/28/2023   Lab Results  Component Value Date   NA 139 04/28/2023   CL 101 04/28/2023   K 3.5 04/28/2023   CO2 27 04/28/2023   BUN 24 (H) 04/28/2023   CREATININE 1.20 04/28/2023   GFR 40.95 (L) 04/28/2023   CALCIUM 9.2 04/28/2023   PHOS 3.7 11/18/2020   ALBUMIN 3.8 04/28/2023   GLUCOSE 106 (H) 04/28/2023   No changes in bowel habits, she still alternates between diarrhea and constipation. Negative for abdominal pain, vomiting, blood in stool or melena.   Insomnia:  She has been taking trazodone 50 mg nightly.  She says she sleeps for about 6 hours per night, still has some difficulty falling asleep but sleeps through the night.  She has not noted side effects.  Concerns today:  Chronic back pain, she follows with pain management and has not tolerated other medications. She was recently reccommended  Belbuca 75 mcg film, she has taken it a few times but has some concerns. She is specially worried about risk of addiction and constipation. It did cause constipation but she was given a prescription  to help with this.  Review of Systems  Constitutional:  Positive for fatigue. Negative for chills and fever.  HENT:  Negative for mouth sores and sore throat.   Respiratory:  Negative for choking, shortness of breath and wheezing.   Cardiovascular:  Negative for chest pain, palpitations and leg swelling.  Genitourinary:  Negative for decreased urine volume, dysuria and hematuria.  Musculoskeletal:  Positive for back pain.  Skin:  Negative for rash.  Neurological:  Negative for syncope, facial asymmetry and headaches.  Psychiatric/Behavioral:  Negative for confusion and hallucinations.   See other pertinent positives and negatives in HPI.  Current Outpatient Medications on File Prior to Visit  Medication Sig Dispense Refill   alendronate (FOSAMAX) 70 MG tablet TAKE 1 TABLET(70 MG) BY MOUTH EVERY 7 DAYS WITH A FULL GLASS OF WATER AND ON AN EMPTY STOMACH 4 tablet 11   atorvastatin (LIPITOR) 40 MG tablet TAKE 1 TABLET DAILY 90 tablet 3   Biotin 5 MG CAPS Take 1 capsule by mouth daily.     Buprenorphine HCl (BELBUCA) 75 MCG FILM Place 75 mcg inside cheek daily.     diltiazem (CARDIZEM) 30 MG tablet TAKE 1 TABLET(30 MG) BY MOUTH THREE TIMES DAILY 270 tablet 2   gabapentin (NEURONTIN) 100 MG capsule Take 1-2 capsules (100-200 mg total) by mouth at bedtime. 60 capsule 1   hydrochlorothiazide (HYDRODIURIL) 25  MG tablet TAKE 1 TABLET DAILY 90 tablet 3   lisinopril (ZESTRIL) 30 MG tablet TAKE 1 TABLET(30 MG) BY MOUTH DAILY 90 tablet 2   Magnesium 400 MG CAPS Take 400 mg by mouth daily.     Multiple Vitamin (MULTIVITAMIN) tablet Take 1 tablet by mouth daily.     pantoprazole (PROTONIX) 40 MG tablet TAKE 1 TABLET(40 MG) BY MOUTH DAILY 90 tablet 0   traZODone (DESYREL) 50 MG tablet TAKE 1 TABLET(50 MG) BY MOUTH AT BEDTIME 30 tablet 1   No current facility-administered medications on file prior to visit.   Past Medical History:  Diagnosis Date   Bullous pemphigoid    Hypertension    Allergies   Allergen Reactions   Carvedilol Diarrhea   Atenolol Other (See Comments)    Drops heart rate   Baclofen Other (See Comments)    Stroke like symptoms    Hydralazine Hcl Swelling and Other (See Comments)    Swelling on face, arms red from elbow to fingers.   Oxycodone Other (See Comments)    Confusion    Sulfa Antibiotics Other (See Comments)    Childhood allergy    Adhesive [Tape] Rash   Amlodipine Rash and Other (See Comments)    flushing   Social History   Socioeconomic History   Marital status: Widowed    Spouse name: Not on file   Number of children: 1   Years of education: Not on file   Highest education level: Not on file  Occupational History   Not on file  Tobacco Use   Smoking status: Never   Smokeless tobacco: Never  Vaping Use   Vaping status: Never Used  Substance and Sexual Activity   Alcohol use: No    Alcohol/week: 0.0 standard drinks of alcohol   Drug use: No   Sexual activity: Not on file  Other Topics Concern   Not on file  Social History Narrative   Not on file   Social Determinants of Health   Financial Resource Strain: Low Risk  (05/17/2022)   Overall Financial Resource Strain (CARDIA)    Difficulty of Paying Living Expenses: Not hard at all  Food Insecurity: No Food Insecurity (05/17/2022)   Hunger Vital Sign    Worried About Running Out of Food in the Last Year: Never true    Ran Out of Food in the Last Year: Never true  Transportation Needs: No Transportation Needs (05/17/2022)   PRAPARE - Administrator, Civil Service (Medical): No    Lack of Transportation (Non-Medical): No  Physical Activity: Inactive (05/17/2022)   Exercise Vital Sign    Days of Exercise per Week: 0 days    Minutes of Exercise per Session: 0 min  Stress: No Stress Concern Present (05/17/2022)   [employer] of Occupational Health - Occupational Stress Questionnaire    Feeling of Stress : Not at all  Social Connections: Socially Isolated  (05/15/2021)   Social Connection and Isolation Panel [NHANES]    Frequency of Communication with Friends and Family: Twice a week    Frequency of Social Gatherings with Friends and Family: Twice a week    Attends Religious Services: Never    Database administrator or Organizations: No    Attends Banker Meetings: Never    Marital Status: Widowed   Vitals:   06/27/23 1023  BP: 118/70  Pulse: 79  Resp: 16  SpO2: 96%   Wt Readings from Last 3 Encounters:  06/27/23  126 lb (57.2 kg)  04/27/23 126 lb 6 oz (57.3 kg)  04/13/23 129 lb (58.5 kg)   Body mass index is 23.81 kg/m.  Physical Exam Vitals and nursing note reviewed.  Constitutional:      General: She is not in acute distress.    Appearance: She is well-developed.  HENT:     Head: Normocephalic and atraumatic.  Eyes:     Conjunctiva/sclera: Conjunctivae normal.  Cardiovascular:     Rate and Rhythm: Normal rate and regular rhythm.     Heart sounds: Murmur (SEM I/VI RUSB) heard.  Pulmonary:     Effort: Pulmonary effort is normal. No respiratory distress.     Breath sounds: Normal breath sounds.  Abdominal:     Palpations: Abdomen is soft. There is no mass.     Tenderness: There is no abdominal tenderness.  Lymphadenopathy:     Cervical: No cervical adenopathy.  Skin:    General: Skin is warm.     Findings: No erythema or rash.  Neurological:     General: No focal deficit present.     Mental Status: She is alert and oriented to person, place, and time.     Comments: Antalgic gait, not assisted.  Psychiatric:        Mood and Affect: Mood and affect normal.   ASSESSMENT AND PLAN:  Ms. Marolda was seen today for chronic follow up.   Insomnia, unspecified type Assessment & Plan: Still has some difficulty falling asleep but in general Trazodone 50 mg at bedtime is helping. Reports no side effects, sleeps about 6 hours. No changes today. Continue good sleep hygiene.   Chronic bilateral low back pain  without sciatica Assessment & Plan: Concerned about possible side effects of Buprenorphine, so has not taken it as instructed. Reports that pain is gradually getting worse and affecting her daily activities. Other opioids caused severe constipation and MS changes. We discussed some side effects, states that she was given medication to prevent constipation. At this time it seems like the benefit outweigh the risk, so recommend trying medication and monitor for significant side effects. Fall precautions. Follows with chronic pain management.   Gastroesophageal reflux disease, unspecified whether esophagitis present Assessment & Plan: Nausea has greatly improved. Continue Pantoprazole 40 mg daily and GERD precautions. Instructed to follow with her GI if nausea gets worse again.   -Lost about 3 Lb since 04/2023, reports that her appetite is not as good as it was before. Recommend daily Ensure x 1.  I spent a total of 31 minutes in both face to face and non face to face activities for this visit on the date of this encounter. During this time history was obtained and documented, examination was performed, prior labs reviewed, and assessment/plan discussed.  Return in about 6 months (around 12/25/2023).  I, Rolla Etienne Wierda, acting as a scribe for Dustin Burrill [place], MD., have documented all relevant documentation on the behalf of Stephana Morell [place], MD, as directed by  Madden Garron [place], MD while in the presence of Rexford Prevo [place], MD.   I, Kalub Morillo [place], MD, have reviewed all documentation for this visit. The documentation on 06/27/23 for the exam, diagnosis, procedures, and orders are all accurate and complete.  Sameer Teeple G. [place], MD  [facility]. [place] office.

## 2023-06-27 NOTE — Assessment & Plan Note (Signed)
Concerned about possible side effects of Buprenorphine, so has not taken it as instructed. Reports that pain is gradually getting worse and affecting her daily activities. Other opioids caused severe constipation and MS changes. We discussed some side effects, states that she was given medication to prevent constipation. At this time it seems like the benefit outweigh the risk, so recommend trying medication and monitor for significant side effects. Fall precautions. Follows with chronic pain management.

## 2023-06-27 NOTE — Assessment & Plan Note (Signed)
Nausea has greatly improved. Continue Pantoprazole 40 mg daily and GERD precautions. Instructed to follow with her GI if nausea gets worse again.

## 2023-07-07 ENCOUNTER — Other Ambulatory Visit: Payer: Self-pay | Admitting: Family Medicine

## 2023-07-07 DIAGNOSIS — R1013 Epigastric pain: Secondary | ICD-10-CM

## 2023-07-15 ENCOUNTER — Telehealth: Payer: Self-pay | Admitting: Family Medicine

## 2023-07-15 NOTE — Telephone Encounter (Signed)
Pt is having trouble swallowing at night time after taking her Trazodone. No trouble during the day. Having dry mouth.

## 2023-07-15 NOTE — Telephone Encounter (Signed)
I called and spoke with patient. She is wanting to come off of medication. Appointment scheduled for Monday at 10:30 to discuss other options, advised pt I would discuss with PCP to see if she needs to wean off Trazodone & call her back.

## 2023-07-15 NOTE — Telephone Encounter (Signed)
I spoke with patient, she is aware of instructions below & verbalized understanding.

## 2023-07-15 NOTE — Telephone Encounter (Signed)
Per PCP - pt to take 25 mg daily x a week, then every other day x a week, and then every 3rd day x a week.

## 2023-07-15 NOTE — Telephone Encounter (Signed)
There are products OTC that may help, Biotene dry mouth among some. Medications can contribute to this problem. She could also see her dentist for this problem. Thanks, BJ

## 2023-07-15 NOTE — Telephone Encounter (Signed)
[facility] triage nurse is calling and wanted pt to be seen in 24 hrs due to trazodone is causing dry mouth. Please advise

## 2023-07-18 ENCOUNTER — Ambulatory Visit: Payer: Medicare Other | Admitting: Family Medicine

## 2023-08-11 ENCOUNTER — Encounter: Payer: Self-pay | Admitting: Family Medicine

## 2023-08-11 ENCOUNTER — Ambulatory Visit (INDEPENDENT_AMBULATORY_CARE_PROVIDER_SITE_OTHER): Payer: Medicare Other | Admitting: Family Medicine

## 2023-08-11 DIAGNOSIS — Z0000 Encounter for general adult medical examination without abnormal findings: Secondary | ICD-10-CM

## 2023-08-11 NOTE — Patient Instructions (Signed)
 I really enjoyed getting to talk with you today! I am available on Tuesdays and Thursdays for virtual visits if you have any questions or concerns, or if I can be of any further assistance.   CHECKLIST FROM ANNUAL WELLNESS VISIT:  -Follow up (please call to schedule if not scheduled after visit):   -yearly for annual wellness visit with primary care office  Here is a list of your preventive care/health maintenance measures and the plan for each if any are due:  PLAN For any measures below that may be due:   Health Maintenance  Topic Date Due   COVID-19 Vaccine (8 - 2024-25 season) 07/29/2023   Medicare Annual Wellness (AWV)  08/10/2024   DTaP/Tdap/Td (2 - Td or Tdap) 10/08/2026   Pneumonia Vaccine 73+ Years old  Completed   INFLUENZA VACCINE  Completed   DEXA SCAN  Completed   Zoster Vaccines- Shingrix  Completed   HPV VACCINES  Aged Out    -See a dentist at least yearly  -Get your eyes checked and then per your eye specialist's recommendations  -Other issues addressed today:   -I have included below further information regarding a healthy whole foods based diet, physical activity guidelines for adults, stress management and opportunities for social connections. I hope you find this information useful.   -----------------------------------------------------------------------------------------------------------------------------------------------------------------------------------------------------------------------------------------------------------  NUTRITION: -eat real food: lots of colorful vegetables (half the plate) and fruits -5-7 servings of vegetables and fruits per day (fresh or steamed is best), exp. 2 servings of vegetables with lunch and dinner and 2 servings of fruit per day. Berries and greens such as kale and collards are great choices.  -consume on a regular basis: whole grains (make sure first ingredient on label contains the word whole), fresh fruits, fish,  nuts, seeds, healthy oils (such as olive oil, avocado oil, grape seed oil) -may eat small amounts of dairy and lean meat on occasion, but avoid processed meats such as ham, bacon, lunch meat, etc. -drink water -try to avoid fast food and pre-packaged foods, processed meat -most experts advise limiting sodium to < 2300mg  per day, should limit further is any chronic conditions such as high blood pressure, heart disease, diabetes, etc. The American Heart Association advised that < 1500mg  is is ideal -try to avoid foods that contain any ingredients with names you do not recognize  -try to avoid sugar/sweets (except for the natural sugar that occurs in fresh fruit) -try to avoid sweet drinks -try to avoid white rice, white bread, pasta (unless whole grain), white or yellow potatoes  EXERCISE GUIDELINES FOR ADULTS: -if you wish to increase your physical activity, do so gradually and with the approval of your doctor -STOP and seek medical care immediately if you have any chest pain, chest discomfort or trouble breathing when starting or increasing exercise  -move and stretch your body, legs, feet and arms when sitting for long periods -Physical activity guidelines for optimal health in adults: -least 150 minutes per week of aerobic exercise (can talk, but not sing) once approved by your doctor, 20-30 minutes of sustained activity or two 10 minute episodes of sustained activity every day.  -resistance training at least 2 days per week if approved by your doctor -balance exercises 3+ days per week:   Stand somewhere where you have something sturdy to hold onto if you lose balance.    1) lift up on toes, start with 5x per day and work up to 20x   2) stand and lift on leg straight  out to the side so that foot is a few inches of the floor, start with 5x each side and work up to 20x each side   3) stand on one foot, start with 5 seconds each side and work up to 20 seconds on each side  If you need ideas or  help with getting more active:  -Silver sneakers https://tools.silversneakers.com  -Walk with a Doc: Http://www.duncan-williams.com/  -try to include resistance (weight lifting/strength building) and balance exercises twice per week: or the following link for ideas: http://castillo-powell.com/  buyducts.dk  STRESS MANAGEMENT: -can try meditating, or just sitting quietly with deep breathing while intentionally relaxing all parts of your body for 5 minutes daily -if you need further help with stress, anxiety or depression please follow up with your primary doctor or contact the wonderful folks at [facility]: [phone]  SOCIAL CONNECTIONS: -options in [place] if you wish to engage in more social and exercise related activities:  -Silver sneakers https://tools.silversneakers.com  -Walk with a Doc: Http://www.duncan-williams.com/  -Check out the [facility] Active Adults 50+ section on the [place] of [employer] companies (hiking clubs, book clubs, cards and games, chess, exercise classes, aquatic classes and much more) - see the website for details: https://www.[place].gov/departments/parks-recreation/active-adults50  -YouTube has lots of exercise videos for different ages and abilities as well  -[facility] (a variety of indoor and outdoor inperson activities for adults). [phone]. [address]  -Virtual Online Classes (a variety of topics): see seniorplanet.org or call [phone]  -consider volunteering at a school, hospice center, church, senior center or elsewhere

## 2023-08-11 NOTE — Progress Notes (Signed)
 Patient unable to obtain vital signs due to telehealth visit

## 2023-08-11 NOTE — Progress Notes (Signed)
 PATIENT CHECK-IN and HEALTH RISK ASSESSMENT QUESTIONNAIRE:  -completed by phone/video for upcoming Medicare Preventive Visit   Pre-Visit Check-in: 1)Vitals (height, wt, BP, etc) - record in vitals section for visit on day of visit Request home vitals (wt, BP, etc.) and enter into vitals, THEN update Vital Signs SmartPhrase below at the top of the HPI. See below.  2)Review and Update Medications, Allergies PMH, Surgeries, Social history in Epic 3)Hospitalizations in the last year with date/reason? no  4)Review and Update Care Team (patient's specialists) in Epic 5) Complete PHQ9 in Epic  6) Complete Fall Screening in Epic 7)Review all Health Maintenance Due and order under PCP if not done.  Medicare Wellness Patient Questionnaire:  Answer theses question about your habits: How often do you have a drink containing alcohol? no Have you ever smoked?No   Do you use smokeless tobacco?No Do you use an illicit drugs?no On average, how many days per week do you engage in moderate to strenuous exercise (like a brisk walk)? Has back issues, scoliosis and spondylosis - does not due much exercise as is in pain when is on her feet.  Typical breakfast: Toast, Oatmeal, Bannana Typical lunch:Varies, veggies and meats - lunch is provided at her residential community Typical dinner: Varies, leftovers Typical snacks: Cheese   Beverages: Coffee, un-sweet tea, rare soft drinks  Answer theses question about your everyday activities: Can you perform most household chores?Yes Are you deaf or have significant trouble hearing?Yes Do you feel that you have a problem with memory? No Do you feel safe at home? Yes Last dentist visit? 3 months ago 8. Do you have any difficulty performing your everyday activities?Very little  Are you having any difficulty walking, taking medications on your own, and or difficulty managing daily home needs? No Do you have difficulty walking or climbing stairs?Yes  Do you have  difficulty dressing or bathing? No Do you have difficulty doing errands alone such as visiting a doctor's office or shopping? No Do you currently have any difficulty preparing food and eating?No Do you currently have any difficulty using the toilet?No Do you have any difficulty managing your finances?No Do you have any difficulties with housekeeping of managing your housekeeping? No   Do you have Advanced Directives in place (Living Will, Healthcare Power or Attorney)? Yes   Last eye Exam and location? 4 months ago [facility]    Do you currently use prescribed or non-prescribed narcotic or opioid pain medications? No  Do you have a history or close family history of breast, ovarian, tubal or peritoneal cancer or a family member with BRCA (breast cancer susceptibility 1 and 2) gene mutations?No   Nurse/Assistant Credentials/time stamp: MG 9:42 AM    ----------------------------------------------------------------------------------------------------------------------------------------------------------------------------------------------------------------------  Because this visit was a virtual/telehealth visit, some criteria may be missing or patient reported. Any vitals not documented were not able to be obtained and vitals that have been documented are patient reported.    MEDICARE ANNUAL PREVENTIVE VISIT WITH PROVIDER: (Welcome to Medicare, initial annual wellness or annual wellness exam)  Virtual Visit via Phone Note  I connected with Amber Baird on 08/11/23 by phone  and verified that I am speaking with the correct person using two identifiers.  Location patient: home Location provider:work or home office Persons participating in the virtual visit: patient, provider  Concerns and/or follow up today: reports is doing ok and all is stable. She thinks poor appetite was also caused by sleeping pill - appetite has now returned and is eating well.  See HM section in  Epic for other details of completed HM.    ROS: negative for report of fevers, unintentional weight loss, vision changes, vision loss, hearing loss or change, chest pain, sob, hemoptysis, melena, hematochezia, hematuria, falls, bleeding or bruising, thoughts of suicide or self harm, memory loss  Patient-completed extensive health risk assessment - reviewed and discussed with the patient: See Health Risk Assessment completed with patient prior to the visit either above or in recent phone note. This was reviewed in detailed with the patient today and appropriate recommendations, orders and referrals were placed as needed per Summary below and patient instructions.   Review of Medical History: -PMH, PSH, Family History and current specialty and care providers reviewed and updated and listed below   Patient Care Team: Jordan, Betty G, MD as PCP - General (Family Medicine)   Past Medical History:  Diagnosis Date   Bullous pemphigoid    Hypertension     Past Surgical History:  Procedure Laterality Date   APPENDECTOMY  1980   BREAST BIOPSY  1980   SPINE SURGERY     VAGINAL HYSTERECTOMY  1980    Social History   Socioeconomic History   Marital status: Widowed    Spouse name: Not on file   Number of children: 1   Years of education: Not on file   Highest education level: Not on file  Occupational History   Not on file  Tobacco Use   Smoking status: Never   Smokeless tobacco: Never  Vaping Use   Vaping status: Never Used  Substance and Sexual Activity   Alcohol use: No    Alcohol/week: 0.0 standard drinks of alcohol   Drug use: No   Sexual activity: Not on file  Other Topics Concern   Not on file  Social History Narrative   Not on file   Social Drivers of Health   Financial Resource Strain: Low Risk  (08/11/2023)   Overall Financial Resource Strain (CARDIA)    Difficulty of Paying Living Expenses: Not hard at all  Food Insecurity: No Food Insecurity (08/11/2023)   Hunger  Vital Sign    Worried About Running Out of Food in the Last Year: Never true    Ran Out of Food in the Last Year: Never true  Transportation Needs: No Transportation Needs (08/11/2023)   PRAPARE - Administrator, Civil Service (Medical): No    Lack of Transportation (Non-Medical): No  Physical Activity: Inactive (08/11/2023)   Exercise Vital Sign    Days of Exercise per Week: 0 days    Minutes of Exercise per Session: 0 min  Stress: No Stress Concern Present (08/11/2023)   Amber Baird of Occupational Health - Occupational Stress Questionnaire    Feeling of Stress : Not at all  Social Connections: Socially Isolated (08/11/2023)   Social Connection and Isolation Panel [NHANES]    Frequency of Communication with Friends and Family: More than three times a week    Frequency of Social Gatherings with Friends and Family: Once a week    Attends Religious Services: Never    Database Administrator or Organizations: No    Attends Banker Meetings: Never    Marital Status: Widowed  Intimate Partner Violence: Not At Risk (08/11/2023)   Humiliation, Afraid, Rape, and Kick questionnaire    Fear of Current or Ex-Partner: No    Emotionally Abused: No    Physically Abused: No    Sexually Abused: No  Family History  Problem Relation Age of Onset   Arthritis Other    High Cholesterol Mother    Heart disease Mother    Hypertension Mother     Current Outpatient Medications on File Prior to Visit  Medication Sig Dispense Refill   alendronate  (FOSAMAX ) 70 MG tablet TAKE 1 TABLET(70 MG) BY MOUTH EVERY 7 DAYS WITH A FULL GLASS OF WATER AND ON AN EMPTY STOMACH 4 tablet 11   atorvastatin  (LIPITOR) 40 MG tablet TAKE 1 TABLET DAILY 90 tablet 3   Biotin 5 MG CAPS Take 1 capsule by mouth daily.     diltiazem  (CARDIZEM ) 30 MG tablet TAKE 1 TABLET(30 MG) BY MOUTH THREE TIMES DAILY 270 tablet 2   gabapentin  (NEURONTIN ) 100 MG capsule Take 1-2 capsules (100-200 mg total) by mouth at  bedtime. 60 capsule 1   hydrochlorothiazide  (HYDRODIURIL ) 25 MG tablet TAKE 1 TABLET DAILY 90 tablet 3   lisinopril  (ZESTRIL ) 30 MG tablet TAKE 1 TABLET(30 MG) BY MOUTH DAILY 90 tablet 2   Multiple Vitamin (MULTIVITAMIN) tablet Take 1 tablet by mouth daily.     pantoprazole  (PROTONIX ) 40 MG tablet TAKE 1 TABLET(40 MG) BY MOUTH DAILY 90 tablet 1   Buprenorphine HCl (BELBUCA) 75 MCG FILM Place 75 mcg inside cheek daily. (Patient not taking: Reported on 08/11/2023)     Magnesium 400 MG CAPS Take 400 mg by mouth daily. (Patient not taking: Reported on 08/11/2023)     No current facility-administered medications on file prior to visit.    Allergies  Allergen Reactions   Carvedilol  Diarrhea   Atenolol Other (See Comments)    Drops heart rate   Baclofen Other (See Comments)    Stroke like symptoms    Hydralazine  Hcl Swelling and Other (See Comments)    Swelling on face, arms red from elbow to fingers.   Oxycodone Other (See Comments)    Confusion    Sulfa Antibiotics Other (See Comments)    Childhood allergy    Trazodone  Other (See Comments)   Adhesive [Tape] Rash   Amlodipine Rash and Other (See Comments)    flushing       Physical Exam Vitals requested from patient and listed below if patient had equipment and was able to obtain at home for this virtual visit: There were no vitals filed for this visit. Estimated body mass index is 23.81 kg/m as calculated from the following:   Height as of 06/27/23: 5' 1 (1.549 m).   Weight as of 06/27/23: 126 lb (57.2 kg).  EKG (optional): deferred due to virtual visit  GENERAL: alert, oriented, no acute distress detected, full vision exam deferred due to pandemic and/or virtual encounter  PSYCH/NEURO: pleasant and cooperative, no obvious depression or anxiety, speech and thought processing grossly intact, Cognitive function grossly intact  Flowsheet Row Office Visit from 08/11/2023 in [facility]  PHQ-9 Total  Score 1           08/11/2023    9:31 AM 04/27/2023   11:52 AM 03/07/2023   10:27 AM 09/24/2022    2:25 PM 05/17/2022   10:07 AM  Depression screen PHQ 2/9  Decreased Interest 0 0 0 0 0  Down, Depressed, Hopeless 0 0 0 0 0  PHQ - 2 Score 0 0 0 0 0  Altered sleeping 1      Tired, decreased energy 0      Change in appetite 0      Feeling bad or failure about  yourself  0      Trouble concentrating 0      Moving slowly or fidgety/restless 0      Suicidal thoughts 0      PHQ-9 Score 1      Difficult doing work/chores Not difficult at all           05/15/2021   10:53 AM 09/15/2021    1:57 PM 05/17/2022   10:07 AM 09/24/2022    2:25 PM 08/11/2023    9:32 AM  Fall Risk  Falls in the past year? 0 0 0 0 1  Was there an injury with Fall? 0 0 0 0 1  Fall Risk Category Calculator 0 0 0 0 2  Fall Risk Category (Retired) Low Low Low    (RETIRED) Patient Fall Risk Level Low fall risk Low fall risk Low fall risk    Patient at Risk for Falls Due to  No Fall Risks Medication side effect Other (Comment) No Fall Risks  Fall risk Follow up Falls evaluation completed Falls evaluation completed Falls prevention discussed;Education provided;Falls evaluation completed Falls evaluation completed Falls evaluation completed  One fall was a freak accident with cat an door.    SUMMARY AND PLAN:  Encounter for Medicare annual wellness exam   Discussed applicable health maintenance/preventive health measures and advised and referred or ordered per patient preferences: -she had he covid shot in nov, updated her chart -offered to order dexa, she reports she was told to wait 3-4 years on the dexa, reports on fosamax  and tolerating well, she prefers to wait and discuss with [facility] Maintenance  Topic Date Due   COVID-19 Vaccine (8 - 2024-25 season) 07/29/2023   Medicare Annual Wellness (AWV)  08/10/2024   DTaP/Tdap/Td (2 - Td or Tdap) 10/08/2026   Pneumonia Vaccine 81+ Years old  Completed    INFLUENZA VACCINE  Completed   DEXA SCAN  Completed   Zoster Vaccines- Shingrix  Completed   HPV VACCINES  Aged Out    Education and counseling on the following was provided based on the above review of health and a plan/checklist for the patient, along with additional information discussed, was provided for the patient in the patient instructions :  -Provided afe balance exercises that can be done at home - see pt instructions -Advised and counseled on a healthy lifestyle - including the importance of a healthy diet, regular physical activity, social connections and stress management. -Reviewed patient's current diet. Advised and counseled on a whole foods based healthy diet. A summary of a healthy diet was provided in the Patient Instructions.  -reviewed patient's current physical activity level and discussed exercise guidelines for adults. Discussed community resources and ideas for safe exercise at home to assist in meeting exercise guideline recommendations in a safe and healthy way.  -Advise yearly dental visits at minimum and regular eye exams   Follow up: see patient instructions     Patient Instructions  I really enjoyed getting to talk with you today! I am available on Tuesdays and Thursdays for virtual visits if you have any questions or concerns, or if I can be of any further assistance.   CHECKLIST FROM ANNUAL WELLNESS VISIT:  -Follow up (please call to schedule if not scheduled after visit):   -yearly for annual wellness visit with primary care office  Here is a list of your preventive care/health maintenance measures and the plan for each if any are due:  PLAN For any measures below that may  be due:   Health Maintenance  Topic Date Due   COVID-19 Vaccine (8 - 2024-25 season) 07/29/2023   Medicare Annual Wellness (AWV)  08/10/2024   DTaP/Tdap/Td (2 - Td or Tdap) 10/08/2026   Pneumonia Vaccine 63+ Years old  Completed   INFLUENZA VACCINE  Completed   DEXA SCAN   Completed   Zoster Vaccines- Shingrix  Completed   HPV VACCINES  Aged Out    -See a dentist at least yearly  -Get your eyes checked and then per your eye specialist's recommendations  -Other issues addressed today:   -I have included below further information regarding a healthy whole foods based diet, physical activity guidelines for adults, stress management and opportunities for social connections. I hope you find this information useful.   -----------------------------------------------------------------------------------------------------------------------------------------------------------------------------------------------------------------------------------------------------------  NUTRITION: -eat real food: lots of colorful vegetables (half the plate) and fruits -5-7 servings of vegetables and fruits per day (fresh or steamed is best), exp. 2 servings of vegetables with lunch and dinner and 2 servings of fruit per day. Berries and greens such as kale and collards are great choices.  -consume on a regular basis: whole grains (make sure first ingredient on label contains the word whole), fresh fruits, fish, nuts, seeds, healthy oils (such as olive oil, avocado oil, grape seed oil) -may eat small amounts of dairy and lean meat on occasion, but avoid processed meats such as ham, bacon, lunch meat, etc. -drink water -try to avoid fast food and pre-packaged foods, processed meat -most experts advise limiting sodium to < 2300mg  per day, should limit further is any chronic conditions such as high blood pressure, heart disease, diabetes, etc. The American Heart Association advised that < 1500mg  is is ideal -try to avoid foods that contain any ingredients with names you do not recognize  -try to avoid sugar/sweets (except for the natural sugar that occurs in fresh fruit) -try to avoid sweet drinks -try to avoid white rice, white bread, pasta (unless whole grain), white or yellow  potatoes  EXERCISE GUIDELINES FOR ADULTS: -if you wish to increase your physical activity, do so gradually and with the approval of your doctor -STOP and seek medical care immediately if you have any chest pain, chest discomfort or trouble breathing when starting or increasing exercise  -move and stretch your body, legs, feet and arms when sitting for long periods -Physical activity guidelines for optimal health in adults: -least 150 minutes per week of aerobic exercise (can talk, but not sing) once approved by your doctor, 20-30 minutes of sustained activity or two 10 minute episodes of sustained activity every day.  -resistance training at least 2 days per week if approved by your doctor -balance exercises 3+ days per week:   Stand somewhere where you have something sturdy to hold onto if you lose balance.    1) lift up on toes, start with 5x per day and work up to 20x   2) stand and lift on leg straight out to the side so that foot is a few inches of the floor, start with 5x each side and work up to 20x each side   3) stand on one foot, start with 5 seconds each side and work up to 20 seconds on each side  If you need ideas or help with getting more active:  -Silver sneakers https://tools.silversneakers.com  -Walk with a Doc: Http://www.duncan-williams.com/  -try to include resistance (weight lifting/strength building) and balance exercises twice per week: or the following link for ideas: http://castillo-powell.com/  buyducts.dk  STRESS MANAGEMENT: -can try meditating, or just sitting quietly with deep breathing while intentionally relaxing all parts of your body for 5 minutes daily -if you need further help with stress, anxiety or depression please follow up with your primary doctor or contact the wonderful folks at [facility]: [phone]  SOCIAL CONNECTIONS: -options in [place]  if you wish to engage in more social and exercise related activities:  -Silver sneakers https://tools.silversneakers.com  -Walk with a Doc: Http://www.duncan-williams.com/  -Check out the [facility] Active Adults 50+ section on the [place] [employer] (hiking clubs, book clubs, cards and games, chess, exercise classes, aquatic classes and much more) - see the website for details: https://www.[place].gov/departments/parks-recreation/active-adults50  -YouTube has lots of exercise videos for different ages and abilities as well  -[facility] (a variety of indoor and outdoor inperson activities for adults). 832-280-3443. [address]  -Virtual Online Classes (a variety of topics): see seniorplanet.org or call (804)489-9620  -consider volunteering at a school, hospice center, church, senior center or elsewhere           Chiquita JONELLE Cramp, DO

## 2023-08-15 ENCOUNTER — Ambulatory Visit: Payer: Medicare Other | Admitting: Family Medicine

## 2023-08-25 ENCOUNTER — Other Ambulatory Visit: Payer: Self-pay | Admitting: Family Medicine

## 2023-08-30 ENCOUNTER — Other Ambulatory Visit: Payer: Self-pay | Admitting: Family Medicine

## 2023-08-30 DIAGNOSIS — I10 Essential (primary) hypertension: Secondary | ICD-10-CM

## 2023-09-20 DIAGNOSIS — H35372 Puckering of macula, left eye: Secondary | ICD-10-CM | POA: Diagnosis not present

## 2023-09-20 DIAGNOSIS — H353121 Nonexudative age-related macular degeneration, left eye, early dry stage: Secondary | ICD-10-CM | POA: Diagnosis not present

## 2023-09-20 DIAGNOSIS — H04123 Dry eye syndrome of bilateral lacrimal glands: Secondary | ICD-10-CM | POA: Diagnosis not present

## 2023-09-20 DIAGNOSIS — H40013 Open angle with borderline findings, low risk, bilateral: Secondary | ICD-10-CM | POA: Diagnosis not present

## 2023-09-29 ENCOUNTER — Other Ambulatory Visit: Payer: Medicare Other

## 2023-09-30 DIAGNOSIS — H04123 Dry eye syndrome of bilateral lacrimal glands: Secondary | ICD-10-CM | POA: Diagnosis not present

## 2023-09-30 DIAGNOSIS — H16223 Keratoconjunctivitis sicca, not specified as Sjogren's, bilateral: Secondary | ICD-10-CM | POA: Diagnosis not present

## 2023-12-27 ENCOUNTER — Ambulatory Visit: Payer: Self-pay | Admitting: Family Medicine

## 2023-12-27 ENCOUNTER — Ambulatory Visit (INDEPENDENT_AMBULATORY_CARE_PROVIDER_SITE_OTHER): Payer: Medicare Other | Admitting: Family Medicine

## 2023-12-27 ENCOUNTER — Encounter: Payer: Self-pay | Admitting: Family Medicine

## 2023-12-27 VITALS — BP 122/70 | HR 76 | Temp 97.6°F | Resp 16 | Ht 61.0 in | Wt 132.4 lb

## 2023-12-27 DIAGNOSIS — E7800 Pure hypercholesterolemia, unspecified: Secondary | ICD-10-CM | POA: Diagnosis not present

## 2023-12-27 DIAGNOSIS — R1013 Epigastric pain: Secondary | ICD-10-CM | POA: Diagnosis not present

## 2023-12-27 DIAGNOSIS — K219 Gastro-esophageal reflux disease without esophagitis: Secondary | ICD-10-CM

## 2023-12-27 DIAGNOSIS — I10 Essential (primary) hypertension: Secondary | ICD-10-CM

## 2023-12-27 DIAGNOSIS — E782 Mixed hyperlipidemia: Secondary | ICD-10-CM

## 2023-12-27 DIAGNOSIS — N1832 Chronic kidney disease, stage 3b: Secondary | ICD-10-CM | POA: Diagnosis not present

## 2023-12-27 DIAGNOSIS — G4700 Insomnia, unspecified: Secondary | ICD-10-CM

## 2023-12-27 LAB — LIPID PANEL
Cholesterol: 184 mg/dL (ref 0–200)
HDL: 72.2 mg/dL (ref >39.00)
LDL Cholesterol: 90 mg/dL (ref 0–99)
NonHDL: 111.47
Total CHOL/HDL Ratio: 3
Triglycerides: 105 mg/dL (ref 0.0–149.0)
VLDL: 21 mg/dL (ref 0.0–40.0)

## 2023-12-27 LAB — BASIC METABOLIC PANEL WITH GFR
BUN: 23 mg/dL (ref 6–23)
CO2: 25 meq/L (ref 19–32)
Calcium: 9.8 mg/dL (ref 8.4–10.5)
Chloride: 98 meq/L (ref 96–112)
Creatinine, Ser: 1.2 mg/dL (ref 0.40–1.20)
GFR: 40.76 mL/min — ABNORMAL LOW (ref >60.00)
Glucose, Bld: 97 mg/dL (ref 70–99)
Potassium: 4 meq/L (ref 3.5–5.1)
Sodium: 133 meq/L — ABNORMAL LOW (ref 135–145)

## 2023-12-27 MED ORDER — ATORVASTATIN CALCIUM 40 MG PO TABS: 40.0000 mg | ORAL_TABLET | Freq: Every day | ORAL | 3 refills | Status: AC

## 2023-12-27 MED ORDER — PANTOPRAZOLE SODIUM 40 MG PO TBEC: 40.0000 mg | DELAYED_RELEASE_TABLET | Freq: Every day | ORAL | 2 refills | Status: DC

## 2023-12-27 NOTE — Assessment & Plan Note (Signed)
 Problem is well controlled. Continue Pantoprazole  40 mg daily and GERD precautions.

## 2023-12-27 NOTE — Assessment & Plan Note (Signed)
 She did not tolerate Trazodone  in the past and not longer on Gabapentin . Reports that problem has improved with non pharmacologic treatment. She still wakes up in the middle of the night but feels rested next day and not interested in adding medications. Continue good sleep hygiene.

## 2023-12-27 NOTE — Assessment & Plan Note (Addendum)
 Problem has been stable. Continue low salt diet, adequate hydration,and avoidance of NSAID's. BP is adequately controlled. Currently on Diltiazem  and Lisinopril . Follows with nephrologist annually.

## 2023-12-27 NOTE — Progress Notes (Signed)
 HPI: Amber Baird is a 89 y.o. female with a PMHx significant for aortic atherosclerosis, HTN, GERD, CKD III, chronic diarrhea, HLD, back pain, and insomnia, who is here today for chronic disease management.  Last seen on 06/27/2023  CKD III:  Last saw nephrology in 03/2023.  Negative for foam in urine or decreased urine output.  Hypertension:  Medications: Currently on hydrochlorothiazide  25 mg daily, lisinopril  30 mg daily, and diltiazem  30 mg three times daily.  BP readings at home: She checks her BP occasionally at home and says has had one low diastolic reading of 52, but otherwise her readings have been 120/70's Side effects: none Vision: UTD on routine vision care.  Negative for unusual or severe headache, visual changes, exertional chest pain, dyspnea,  focal weakness, or edema.  Lab Results  Component Value Date   CREATININE 1.20 04/28/2023   BUN 24 (H) 04/28/2023   NA 139 04/28/2023   K 3.5 04/28/2023   CL 101 04/28/2023   CO2 27 04/28/2023   Hyperlipidemia: Currently on atorvastatin  40 mg daily.  Side effects from medication: none Lab Results  Component Value Date   CHOL 164 11/18/2020   HDL 54.70 11/18/2020   LDLCALC 81 11/18/2020   TRIG 139.0 11/18/2020   CHOLHDL 3 11/18/2020   GERD:  Currently on pantoprazole  40 mg daily.  Negative for abdominal pain, nausea, changes in bowel habits, or melena.  Insomnia:  No longer taking trazodone .  She says she is sleeping much better now.  States that she sleeps for 3 hours and wakes up for a couple hours, then goes back to sleep. Feels rested.  Chronic back pain:  Not taking buprenorphine HCl or gabapentin .  She says her "nerve pain" has greatly improved,"gone" but still has some pressure like pain in her lower back, which she believes is related to scoliosis.  Follows with ortho regularly.  Review of Systems  Constitutional:  Negative for activity change, appetite change and fever.  HENT:  Negative  for sore throat and trouble swallowing.   Respiratory:  Negative for cough and wheezing.   Gastrointestinal:  Negative for blood in stool and vomiting.  Genitourinary:  Negative for decreased urine volume, dysuria and hematuria.  Musculoskeletal:  Positive for back pain.  Skin:  Negative for rash.  Neurological:  Negative for syncope and facial asymmetry.  Psychiatric/Behavioral:  Positive for sleep disturbance. Negative for confusion.   See other pertinent positives and negatives in HPI.  Current Outpatient Medications on File Prior to Visit  Medication Sig Dispense Refill   alendronate  (FOSAMAX ) 70 MG tablet TAKE 1 TABLET(70 MG) BY MOUTH EVERY 7 DAYS WITH A FULL GLASS OF WATER AND ON AN EMPTY STOMACH 12 tablet 3   atorvastatin  (LIPITOR) 40 MG tablet TAKE 1 TABLET DAILY 90 tablet 3   Biotin 5 MG CAPS Take 1 capsule by mouth daily.     diltiazem  (CARDIZEM ) 30 MG tablet TAKE 1 TABLET(30 MG) BY MOUTH THREE TIMES DAILY 270 tablet 2   hydrochlorothiazide  (HYDRODIURIL ) 25 MG tablet TAKE 1 TABLET DAILY 90 tablet 3   lisinopril  (ZESTRIL ) 30 MG tablet TAKE 1 TABLET(30 MG) BY MOUTH DAILY 90 tablet 2   Magnesium 400 MG CAPS Take 400 mg by mouth daily.     Multiple Vitamin (MULTIVITAMIN) tablet Take 1 tablet by mouth daily.     pantoprazole  (PROTONIX ) 40 MG tablet TAKE 1 TABLET(40 MG) BY MOUTH DAILY 90 tablet 1   No current facility-administered medications on file prior to  visit.   Past Medical History:  Diagnosis Date   Bullous pemphigoid    Hypertension    Allergies  Allergen Reactions   Carvedilol  Diarrhea   Atenolol Other (See Comments)    Drops heart rate   Baclofen Other (See Comments)    Stroke like symptoms    Hydralazine  Hcl Swelling and Other (See Comments)    Swelling on face, arms red from elbow to fingers.   Oxycodone Other (See Comments)    Confusion    Sulfa Antibiotics Other (See Comments)    Childhood allergy    Trazodone  Other (See Comments)   Adhesive [Tape] Rash    Amlodipine Rash and Other (See Comments)    flushing    Social History   Socioeconomic History   Marital status: Widowed    Spouse name: Not on file   Number of children: 1   Years of education: Not on file   Highest education level: Not on file  Occupational History   Not on file  Tobacco Use   Smoking status: Never   Smokeless tobacco: Never  Vaping Use   Vaping status: Never Used  Substance and Sexual Activity   Alcohol use: No    Alcohol/week: 0.0 standard drinks of alcohol   Drug use: No   Sexual activity: Not on file  Other Topics Concern   Not on file  Social History Narrative   Not on file   Social Drivers of Health   Financial Resource Strain: Low Risk  (08/11/2023)   Overall Financial Resource Strain (CARDIA)    Difficulty of Paying Living Expenses: Not hard at all  Food Insecurity: No Food Insecurity (08/11/2023)   Hunger Vital Sign    Worried About Running Out of Food in the Last Year: Never true    Ran Out of Food in the Last Year: Never true  Transportation Needs: No Transportation Needs (08/11/2023)   PRAPARE - Administrator, Civil Service (Medical): No    Lack of Transportation (Non-Medical): No  Physical Activity: Inactive (08/11/2023)   Exercise Vital Sign    Days of Exercise per Week: 0 days    Minutes of Exercise per Session: 0 min  Stress: No Stress Concern Present (08/11/2023)   [employer] of Occupational Health - Occupational Stress Questionnaire    Feeling of Stress : Not at all  Social Connections: Socially Isolated (08/11/2023)   Social Connection and Isolation Panel [NHANES]    Frequency of Communication with Friends and Family: More than three times a week    Frequency of Social Gatherings with Friends and Family: Once a week    Attends Religious Services: Never    Database administrator or Organizations: No    Attends Banker Meetings: Never    Marital Status: Widowed   Vitals:   12/27/23 0942  BP: 122/70   Pulse: 76  Resp: 16  Temp: 97.6 F (36.4 C)  SpO2: 99%   Body mass index is 25.01 kg/m.  Physical Exam Vitals and nursing note reviewed.  Constitutional:      General: She is not in acute distress.    Appearance: She is well-developed.  HENT:     Head: Normocephalic and atraumatic.     Mouth/Throat:     Mouth: Mucous membranes are moist.     Pharynx: Oropharynx is clear.  Eyes:     Conjunctiva/sclera: Conjunctivae normal.  Cardiovascular:     Rate and Rhythm: Normal rate and regular rhythm.  Pulses:          Dorsalis pedis pulses are 2+ on the right side and 2+ on the left side.     Heart sounds: No murmur (not heard today.) heard. Pulmonary:     Effort: Pulmonary effort is normal. No respiratory distress.     Breath sounds: Normal breath sounds.  Abdominal:     Palpations: Abdomen is soft. There is no mass.     Tenderness: There is no abdominal tenderness.  Musculoskeletal:     Right lower leg: No edema.     Left lower leg: No edema.  Skin:    General: Skin is warm.     Findings: No erythema or rash.  Neurological:     General: No focal deficit present.     Mental Status: She is alert and oriented to person, place, and time.     Comments: Mildly antalgic gait, not assisted.  Psychiatric:        Mood and Affect: Mood and affect normal.    ASSESSMENT AND PLAN:  Amber Baird was seen today for chronic disease management.   Orders Placed This Encounter  Procedures   Basic metabolic panel with GFR   Lipid panel   Lab Results  Component Value Date   NA 133 (L) 12/27/2023   CL 98 12/27/2023   K 4.0 12/27/2023   CO2 25 12/27/2023   BUN 23 12/27/2023   CREATININE 1.20 12/27/2023   GFR 40.76 (L) 12/27/2023   CALCIUM  9.8 12/27/2023   PHOS 3.7 11/18/2020   ALBUMIN 3.8 04/28/2023   GLUCOSE 97 12/27/2023   Lab Results  Component Value Date   CHOL 184 12/27/2023   HDL 72.20 12/27/2023   LDLCALC 90 12/27/2023   TRIG 105.0 12/27/2023   CHOLHDL 3 12/27/2023    Insomnia, unspecified type Assessment & Plan: She did not tolerate Trazodone  in the past and not longer on Gabapentin . Reports that problem has improved with non pharmacologic treatment. She still wakes up in the middle of the night but feels rested next day and not interested in adding medications. Continue good sleep hygiene.   Stage 3b chronic kidney disease (HCC) Assessment & Plan: Problem has been stable. Continue low salt diet, adequate hydration,and avoidance of NSAID's. BP is adequately controlled. Currently on Diltiazem  and Lisinopril . Follows with nephrologist annually.  Orders: -     Basic metabolic panel with GFR; Future  Essential hypertension Assessment & Plan: BP adequately controlled, reporting similar numbers at home. Continue Cardizem  30 mg 3 times daily, HCTZ 25 mg daily, and lisinopril  30 mg daily. Continue monitoring BP at home regularly. Eye exam is current.  Orders: -     Basic metabolic panel with GFR; Future  Mixed hyperlipidemia Assessment & Plan: Continue atorvastatin  40 mg daily and low fat diet. Last LDL 8118/2022. Further recommendation will be given according to lab results.  Orders: -     Lipid panel; Future  Gastroesophageal reflux disease, unspecified whether esophagitis present Assessment & Plan: Problem is well controlled. Continue Pantoprazole  40 mg daily and GERD precautions.   Return in about 6 months (around 06/28/2024) for chronic problems.  I, Fritz Jewel Wierda, acting as a scribe for Iyesha Such [place], MD., have documented all relevant documentation on the behalf of Primus Gritton [place], MD, as directed by  Lucella Pommier [place], MD while in the presence of Jacobi Ryant [place], MD.   I, Trust Crago [place], MD, have reviewed all documentation for this visit. The documentation on 12/27/23 for the exam, diagnosis, procedures,  and orders are all accurate and complete.  Alezander Dimaano G. [place], MD  [facility]. [place] office.

## 2023-12-27 NOTE — Assessment & Plan Note (Signed)
 Continue atorvastatin  40 mg daily and low fat diet. Last LDL 8118/2022. Further recommendation will be given according to lab results.

## 2023-12-27 NOTE — Patient Instructions (Addendum)
 A few things to remember from today's visit:  Essential hypertension - Plan: Basic metabolic panel with GFR  Stage 3b chronic kidney disease (HCC), Chronic - Plan: Basic metabolic panel with GFR  Insomnia, unspecified type  Mixed hyperlipidemia - Plan: Lipid panel No changes today.  If you need refills for medications you take chronically, please call your pharmacy. Do not use My Chart to request refills or for acute issues that need immediate attention. If you send a my chart message, it may take a few days to be addressed, specially if I am not in the office.  Please be sure medication list is accurate. If a new problem present, please set up appointment sooner than planned today.

## 2023-12-27 NOTE — Assessment & Plan Note (Signed)
 BP adequately controlled, reporting similar numbers at home. Continue Cardizem  30 mg 3 times daily, HCTZ 25 mg daily, and lisinopril  30 mg daily. Continue monitoring BP at home regularly. Eye exam is current.

## 2024-02-07 ENCOUNTER — Other Ambulatory Visit: Payer: Self-pay | Admitting: Family Medicine

## 2024-02-07 DIAGNOSIS — R1013 Epigastric pain: Secondary | ICD-10-CM

## 2024-03-20 DIAGNOSIS — H40013 Open angle with borderline findings, low risk, bilateral: Secondary | ICD-10-CM | POA: Diagnosis not present

## 2024-03-23 DIAGNOSIS — N1832 Chronic kidney disease, stage 3b: Secondary | ICD-10-CM | POA: Diagnosis not present

## 2024-03-23 DIAGNOSIS — E785 Hyperlipidemia, unspecified: Secondary | ICD-10-CM | POA: Diagnosis not present

## 2024-03-23 DIAGNOSIS — E871 Hypo-osmolality and hyponatremia: Secondary | ICD-10-CM | POA: Diagnosis not present

## 2024-03-23 DIAGNOSIS — I129 Hypertensive chronic kidney disease with stage 1 through stage 4 chronic kidney disease, or unspecified chronic kidney disease: Secondary | ICD-10-CM | POA: Diagnosis not present

## 2024-04-17 ENCOUNTER — Telehealth: Payer: Self-pay

## 2024-04-17 DIAGNOSIS — Z23 Encounter for immunization: Secondary | ICD-10-CM

## 2024-04-17 NOTE — Telephone Encounter (Unsigned)
 Copied from CRM #[id number]. Topic: Clinical - Prescription Issue >> Apr 13, 2024  3:38 PM Macario HERO wrote: Reason for CRM: Patient is calling to request COVID 19 Vaccine to be sent to her pharmacy: [id number] - [place], [place] - [address] AT [facility] [address] [phone] [address] [place] [id number]

## 2024-04-20 MED ORDER — COVID-19 MRNA VAC-TRIS(PFIZER) 30 MCG/0.3ML IM SUSY: 0.3000 mL | PREFILLED_SYRINGE | Freq: Once | INTRAMUSCULAR | 0 refills | Status: AC

## 2024-04-20 NOTE — Telephone Encounter (Signed)
 Attempted to contact pt. Left a detail message for pt that she can do walk in, requires no Rx sent. Call us  back if have any questions.

## 2024-04-20 NOTE — Telephone Encounter (Signed)
 Rx can be sent. Thanks, BJ

## 2024-05-03 DIAGNOSIS — Z23 Encounter for immunization: Secondary | ICD-10-CM | POA: Diagnosis not present

## 2024-05-07 DIAGNOSIS — Z85828 Personal history of other malignant neoplasm of skin: Secondary | ICD-10-CM | POA: Diagnosis not present

## 2024-05-07 DIAGNOSIS — L570 Actinic keratosis: Secondary | ICD-10-CM | POA: Diagnosis not present

## 2024-05-07 DIAGNOSIS — L821 Other seborrheic keratosis: Secondary | ICD-10-CM | POA: Diagnosis not present

## 2024-06-07 ENCOUNTER — Other Ambulatory Visit: Payer: Self-pay | Admitting: Family Medicine

## 2024-06-07 DIAGNOSIS — I10 Essential (primary) hypertension: Secondary | ICD-10-CM

## 2024-06-27 ENCOUNTER — Other Ambulatory Visit: Payer: Self-pay | Admitting: Family Medicine

## 2024-06-27 DIAGNOSIS — I10 Essential (primary) hypertension: Secondary | ICD-10-CM

## 2024-10-29 ENCOUNTER — Ambulatory Visit
